# Patient Record
Sex: Female | Born: 1935 | Race: White | Hispanic: No | Smoking: Never smoker
Health system: Southern US, Community
[De-identification: ages and names within clinical notes are randomized; demographics above are authoritative.]

## PROBLEM LIST (undated history)

## (undated) DIAGNOSIS — F0390 Unspecified dementia without behavioral disturbance: Secondary | ICD-10-CM

## (undated) DIAGNOSIS — R011 Cardiac murmur, unspecified: Secondary | ICD-10-CM

## (undated) DIAGNOSIS — E7800 Pure hypercholesterolemia, unspecified: Secondary | ICD-10-CM

## (undated) DIAGNOSIS — E079 Disorder of thyroid, unspecified: Secondary | ICD-10-CM

## (undated) DIAGNOSIS — I10 Essential (primary) hypertension: Secondary | ICD-10-CM

## (undated) DIAGNOSIS — K649 Unspecified hemorrhoids: Secondary | ICD-10-CM

## (undated) DIAGNOSIS — I219 Acute myocardial infarction, unspecified: Secondary | ICD-10-CM

## (undated) DIAGNOSIS — E039 Hypothyroidism, unspecified: Secondary | ICD-10-CM

## (undated) DIAGNOSIS — K219 Gastro-esophageal reflux disease without esophagitis: Secondary | ICD-10-CM

## (undated) DIAGNOSIS — I609 Nontraumatic subarachnoid hemorrhage, unspecified: Secondary | ICD-10-CM

## (undated) HISTORY — PX: TONSILLECTOMY AND ADENOIDECTOMY: SUR1326

## (undated) HISTORY — PX: TOTAL HIP ARTHROPLASTY: SHX124

## (undated) HISTORY — PX: CORONARY ANGIOPLASTY: SHX604

## (undated) HISTORY — PX: JOINT REPLACEMENT: SHX530

---

## 1993-02-02 DIAGNOSIS — I219 Acute myocardial infarction, unspecified: Secondary | ICD-10-CM

## 1993-02-02 HISTORY — DX: Acute myocardial infarction, unspecified: I21.9

## 1997-07-10 ENCOUNTER — Other Ambulatory Visit: Admission: RE | Admit: 1997-07-10 | Discharge: 1997-07-10 | Payer: Self-pay | Admitting: Family Medicine

## 1998-02-04 ENCOUNTER — Ambulatory Visit (HOSPITAL_COMMUNITY): Admission: RE | Admit: 1998-02-04 | Discharge: 1998-02-04 | Payer: Self-pay | Admitting: Cardiology

## 1998-03-21 ENCOUNTER — Encounter: Payer: Self-pay | Admitting: Orthopedic Surgery

## 1998-03-25 ENCOUNTER — Inpatient Hospital Stay (HOSPITAL_COMMUNITY): Admission: RE | Admit: 1998-03-25 | Discharge: 1998-03-29 | Payer: Self-pay | Admitting: Orthopedic Surgery

## 1998-03-25 ENCOUNTER — Encounter: Payer: Self-pay | Admitting: Orthopedic Surgery

## 1998-04-02 ENCOUNTER — Encounter (HOSPITAL_COMMUNITY): Admission: RE | Admit: 1998-04-02 | Discharge: 1998-07-01 | Payer: Self-pay | Admitting: Orthopedic Surgery

## 2001-12-23 ENCOUNTER — Other Ambulatory Visit: Admission: RE | Admit: 2001-12-23 | Discharge: 2001-12-23 | Payer: Self-pay | Admitting: Family Medicine

## 2003-04-05 ENCOUNTER — Ambulatory Visit (HOSPITAL_COMMUNITY): Admission: RE | Admit: 2003-04-05 | Discharge: 2003-04-05 | Payer: Self-pay | Admitting: Gastroenterology

## 2004-01-04 ENCOUNTER — Ambulatory Visit: Admission: RE | Admit: 2004-01-04 | Discharge: 2004-01-04 | Payer: Self-pay | Admitting: Family Medicine

## 2005-01-24 ENCOUNTER — Emergency Department (HOSPITAL_COMMUNITY): Admission: EM | Admit: 2005-01-24 | Discharge: 2005-01-25 | Payer: Self-pay | Admitting: Emergency Medicine

## 2008-03-11 ENCOUNTER — Emergency Department (HOSPITAL_COMMUNITY): Admission: EM | Admit: 2008-03-11 | Discharge: 2008-03-11 | Payer: Self-pay | Admitting: Emergency Medicine

## 2008-03-12 ENCOUNTER — Encounter: Admission: RE | Admit: 2008-03-12 | Discharge: 2008-05-07 | Payer: Self-pay | Admitting: Family Medicine

## 2010-02-12 ENCOUNTER — Ambulatory Visit: Payer: Self-pay | Admitting: Cardiology

## 2010-05-20 LAB — HEMOCCULT GUIAC POC 1CARD (OFFICE): Fecal Occult Bld: NEGATIVE

## 2010-06-20 NOTE — Op Note (Signed)
NAMEDENEEN, SLAGER                       ACCOUNT NO.:  [id number]   MEDICAL RECORD NO.:  [id number]                   PATIENT TYPE:  AMB   LOCATION:  ENDO                                 FACILITY:  [facility]   PHYSICIAN:  Graylin Shiver, M.D.                DATE OF BIRTH:  10-22-1916   DATE OF PROCEDURE:  04/05/2003  DATE OF DISCHARGE:                                 OPERATIVE REPORT   PROCEDURE:  Colonoscopy.   INDICATIONS FOR PROCEDURE:  Rectal bleeding.   CONSENT:  Informed consent was obtained after explanation of the risks of  bleeding, infection, and perforation.   PREMEDICATION:  Fentanyl 30 mcg IV, Versed 3.5 mg IV.   DESCRIPTION OF PROCEDURE:  With the patient in the left lateral decubitus  position, the rectal exam was performed.  No masses were felt.  The Olympus  colonoscope was inserted into the rectum and advanced around the colon to  the cecum.  Cecal landmarks were identified.  The colon was very tortuous.  The cecum and ascending colon were normal.  The transverse colon was normal.  The descending colon normal.  The sigmoid showed diverticulosis.  The rectum  was normal.  The scope was brought out.  Some small hemorrhoids were seen.  She tolerated the procedure well without complications.   IMPRESSION:  1. Sigmoid diverticulosis.  2. Small hemorrhoids.                                               Graylin Shiver, M.D.    Germain Osgood  D:  04/05/2003  T:  04/05/2003  Job:  [id number]   cc:   Dellis Anes. Idell Pickles, M.D.  [address]  [place]  [place] [address]  Fax: [phone]

## 2010-08-08 ENCOUNTER — Emergency Department (HOSPITAL_COMMUNITY)
Admission: EM | Admit: 2010-08-08 | Discharge: 2010-08-08 | Disposition: A | Discharge status: Home / Self Care | Payer: Medicare Other | Attending: Emergency Medicine | Admitting: Emergency Medicine

## 2010-08-08 ENCOUNTER — Emergency Department (HOSPITAL_COMMUNITY): Payer: Medicare Other

## 2010-08-08 DIAGNOSIS — R0602 Shortness of breath: Secondary | ICD-10-CM | POA: Insufficient documentation

## 2010-08-08 DIAGNOSIS — R42 Dizziness and giddiness: Secondary | ICD-10-CM | POA: Insufficient documentation

## 2010-08-08 DIAGNOSIS — I10 Essential (primary) hypertension: Secondary | ICD-10-CM | POA: Insufficient documentation

## 2010-08-08 DIAGNOSIS — R5381 Other malaise: Secondary | ICD-10-CM | POA: Insufficient documentation

## 2010-08-08 LAB — URINALYSIS, ROUTINE W REFLEX MICROSCOPIC
Glucose, UA: NEGATIVE mg/dL
Leukocytes, UA: NEGATIVE
Protein, ur: NEGATIVE mg/dL
pH: 8 (ref 5.0–8.0)

## 2010-08-08 LAB — DIFFERENTIAL
Basophils Absolute: 0 10*3/uL (ref 0.0–0.1)
Basophils Relative: 1 % (ref 0–1)
Eosinophils Absolute: 0.2 10*3/uL (ref 0.0–0.7)
Eosinophils Relative: 4 % (ref 0–5)
Lymphocytes Relative: 20 % (ref 12–46)
Lymphs Abs: 1.3 10*3/uL (ref 0.7–4.0)
Monocytes Absolute: 0.4 10*3/uL (ref 0.1–1.0)
Monocytes Relative: 6 % (ref 3–12)
Neutro Abs: 4.6 10*3/uL (ref 1.7–7.7)
Neutrophils Relative %: 70 % (ref 43–77)

## 2010-08-08 LAB — BASIC METABOLIC PANEL
CO2: 28 mEq/L (ref 19–32)
Chloride: 107 mEq/L (ref 96–112)
Glucose, Bld: 104 mg/dL — ABNORMAL HIGH (ref 70–99)
Potassium: 4.3 mEq/L (ref 3.5–5.1)
Sodium: 142 mEq/L (ref 135–145)

## 2010-08-08 LAB — CBC
Hemoglobin: 13.4 g/dL (ref 12.0–15.0)
RBC: 4.26 MIL/uL (ref 3.87–5.11)

## 2010-10-02 ENCOUNTER — Emergency Department (HOSPITAL_COMMUNITY)
Admission: EM | Admit: 2010-10-02 | Discharge: 2010-10-02 | Discharge status: Against Medical Advice | Payer: Medicare Other | Attending: Emergency Medicine | Admitting: Emergency Medicine

## 2010-10-02 DIAGNOSIS — K5900 Constipation, unspecified: Secondary | ICD-10-CM | POA: Insufficient documentation

## 2010-10-02 DIAGNOSIS — I10 Essential (primary) hypertension: Secondary | ICD-10-CM | POA: Insufficient documentation

## 2010-10-02 DIAGNOSIS — E039 Hypothyroidism, unspecified: Secondary | ICD-10-CM | POA: Insufficient documentation

## 2011-01-12 ENCOUNTER — Inpatient Hospital Stay (HOSPITAL_BASED_OUTPATIENT_CLINIC_OR_DEPARTMENT_OTHER)
Admission: EM | Admit: 2011-01-12 | Discharge: 2011-01-13 | DRG: 087 | Disposition: A | Discharge status: Home / Self Care | Payer: Medicare Other | Source: Ambulatory Visit | Attending: Neurology | Admitting: Neurology

## 2011-01-12 ENCOUNTER — Emergency Department (INDEPENDENT_AMBULATORY_CARE_PROVIDER_SITE_OTHER): Payer: Medicare Other

## 2011-01-12 ENCOUNTER — Encounter: Payer: Self-pay | Admitting: *Deleted

## 2011-01-12 DIAGNOSIS — M25569 Pain in unspecified knee: Secondary | ICD-10-CM

## 2011-01-12 DIAGNOSIS — Z96649 Presence of unspecified artificial hip joint: Secondary | ICD-10-CM

## 2011-01-12 DIAGNOSIS — S0990XA Unspecified injury of head, initial encounter: Secondary | ICD-10-CM

## 2011-01-12 DIAGNOSIS — S066XAA Traumatic subarachnoid hemorrhage with loss of consciousness status unknown, initial encounter: Principal | ICD-10-CM | POA: Diagnosis present

## 2011-01-12 DIAGNOSIS — W19XXXA Unspecified fall, initial encounter: Secondary | ICD-10-CM | POA: Diagnosis present

## 2011-01-12 DIAGNOSIS — Z79899 Other long term (current) drug therapy: Secondary | ICD-10-CM

## 2011-01-12 DIAGNOSIS — I609 Nontraumatic subarachnoid hemorrhage, unspecified: Secondary | ICD-10-CM

## 2011-01-12 DIAGNOSIS — E079 Disorder of thyroid, unspecified: Secondary | ICD-10-CM | POA: Diagnosis present

## 2011-01-12 DIAGNOSIS — I10 Essential (primary) hypertension: Secondary | ICD-10-CM | POA: Diagnosis present

## 2011-01-12 DIAGNOSIS — M949 Disorder of cartilage, unspecified: Secondary | ICD-10-CM

## 2011-01-12 DIAGNOSIS — S8000XA Contusion of unspecified knee, initial encounter: Secondary | ICD-10-CM | POA: Diagnosis present

## 2011-01-12 DIAGNOSIS — S066X9A Traumatic subarachnoid hemorrhage with loss of consciousness of unspecified duration, initial encounter: Principal | ICD-10-CM | POA: Diagnosis present

## 2011-01-12 DIAGNOSIS — I252 Old myocardial infarction: Secondary | ICD-10-CM

## 2011-01-12 DIAGNOSIS — IMO0002 Reserved for concepts with insufficient information to code with codable children: Secondary | ICD-10-CM

## 2011-01-12 DIAGNOSIS — S0003XA Contusion of scalp, initial encounter: Secondary | ICD-10-CM

## 2011-01-12 DIAGNOSIS — E7800 Pure hypercholesterolemia, unspecified: Secondary | ICD-10-CM | POA: Diagnosis present

## 2011-01-12 HISTORY — DX: Gastro-esophageal reflux disease without esophagitis: K21.9

## 2011-01-12 HISTORY — DX: Essential (primary) hypertension: I10

## 2011-01-12 HISTORY — DX: Unspecified hemorrhoids: K64.9

## 2011-01-12 HISTORY — DX: Disorder of thyroid, unspecified: E07.9

## 2011-01-12 HISTORY — DX: Nontraumatic subarachnoid hemorrhage, unspecified: I60.9

## 2011-01-12 HISTORY — DX: Hypothyroidism, unspecified: E03.9

## 2011-01-12 HISTORY — DX: Cardiac murmur, unspecified: R01.1

## 2011-01-12 HISTORY — DX: Pure hypercholesterolemia, unspecified: E78.00

## 2011-01-12 HISTORY — DX: Acute myocardial infarction, unspecified: I21.9

## 2011-01-12 LAB — CBC
HCT: 41.2 % (ref 36.0–46.0)
Hemoglobin: 13.4 g/dL (ref 12.0–15.0)
RDW: 13.1 % (ref 11.5–15.5)
WBC: 12.1 10*3/uL — ABNORMAL HIGH (ref 4.0–10.5)

## 2011-01-12 LAB — PROTIME-INR
INR: 1.01 (ref 0.00–1.49)
Prothrombin Time: 13.5 seconds (ref 11.6–15.2)

## 2011-01-12 LAB — APTT: aPTT: 27 seconds (ref 24–37)

## 2011-01-12 LAB — BASIC METABOLIC PANEL
CO2: 26 mEq/L (ref 19–32)
Chloride: 107 mEq/L (ref 96–112)
Creatinine, Ser: 0.6 mg/dL (ref 0.50–1.10)
Potassium: 3.8 mEq/L (ref 3.5–5.1)

## 2011-01-12 LAB — DIFFERENTIAL
Basophils Absolute: 0 10*3/uL (ref 0.0–0.1)
Lymphocytes Relative: 14 % (ref 12–46)
Monocytes Absolute: 0.9 10*3/uL (ref 0.1–1.0)
Neutro Abs: 9.4 10*3/uL — ABNORMAL HIGH (ref 1.7–7.7)

## 2011-01-12 MED ORDER — LEVOTHYROXINE SODIUM 100 MCG PO TABS
100.0000 ug | ORAL_TABLET | Freq: Every day | ORAL | Status: DC
  Administered 2011-01-13: 100 ug via ORAL
  Filled 2011-01-12 (×2): qty 1

## 2011-01-12 MED ORDER — OLMESARTAN 10 MG HALF TABLET
10.0000 mg | ORAL_TABLET | Freq: Every day | ORAL | Status: DC
  Administered 2011-01-12 – 2011-01-13 (×2): 10 mg via ORAL
  Filled 2011-01-12 (×2): qty 1

## 2011-01-12 MED ORDER — SIMVASTATIN 40 MG PO TABS
40.0000 mg | ORAL_TABLET | Freq: Every day | ORAL | Status: DC
  Administered 2011-01-12: 40 mg via ORAL
  Filled 2011-01-12 (×2): qty 1

## 2011-01-12 NOTE — ED Notes (Signed)
Pt states she needs bedpan again.the patient voided 350 cc clear yellow urine

## 2011-01-12 NOTE — ED Provider Notes (Addendum)
History     CSN: [id number] Arrival date & time: 01/12/2011 11:18 AM   First MD Initiated Contact with Patient 01/12/11 1253      Chief Complaint  Patient presents with  . Fall    (Consider location/radiation/quality/duration/timing/severity/associated sxs/prior treatment) HPI Patient tripped and fell on standing at the [place] 10 AM today striking her head and right knee. She initially complained of headache and right knee pain now is without complaint. No treatment prior to coming here pain is nonradiating and mild. No other associated symptoms Past Medical History  Diagnosis Date  . Hypertension   . Thyroid disease   . High cholesterol    Myocardial infarction Past Surgical History  Procedure Date  . Cardiac surgery    Bilateral hip replacement No family history on file.  History  Substance Use Topics  . Smoking status: Never Smoker   . Smokeless tobacco: Not on file  . Alcohol Use: No   No alcohol no illicit drugs OB History    Grav Para Term Preterm Abortions TAB SAB Ect Mult Living                  Review of Systems  Constitutional: Negative.   HENT: Negative.   Respiratory: Negative.   Cardiovascular: Negative.   Gastrointestinal: Negative.   Musculoskeletal: Negative.        Walks with cane  Skin: Negative.   Neurological: Negative.   Hematological: Negative.   Psychiatric/Behavioral: Negative.     Allergies  Review of patient's allergies indicates no known allergies.  Home Medications   Current Outpatient Rx  Name Route Sig Dispense Refill  . SYNTHROID PO Oral Take by mouth.      Marland Kitchen SIMVASTATIN PO Oral Take by mouth.      . TRAMADOL HCL PO Oral Take by mouth.      . DIOVAN PO Oral Take by mouth.        BP 138/78  Pulse 82  Temp(Src) 97.9 F (36.6 C) (Oral)  Resp 20  Ht 5\' 3"  (1.6 m)  Wt 132 lb (59.875 kg)  BMI 23.38 kg/m2  SpO2 97%  Physical Exam  Constitutional: She appears well-developed and well-nourished.  HENT:       Golf  ball size hematoma at right temporal area otherwise atraumatic. Bilateral tympanic membranes normal  Eyes: Conjunctivae are normal. Pupils are equal, round, and reactive to light.  Neck: Neck supple. No tracheal deviation present. No thyromegaly present.  Cardiovascular: Normal rate and regular rhythm.   No murmur heard. Pulmonary/Chest: Effort normal and breath sounds normal.  Abdominal: Soft. Bowel sounds are normal. She exhibits no distension. There is no tenderness.  Musculoskeletal: Normal range of motion. She exhibits no edema and no tenderness.  Neurological: She is alert. No cranial nerve deficit. Coordination normal.       Gait is normal, walks with cane  Skin: Skin is warm and dry. No rash noted.  Psychiatric: She has a normal mood and affect.    ED Course  Procedures (including critical care time)  Labs Reviewed - No data to display No results found.  01/12/2011  No diagnosis found. Results for orders placed during the hospital encounter of 08/08/10  DIFFERENTIAL      Component Value Range   Neutrophils Relative 70  43 - 77 (%)   Neutro Abs 4.6  1.7 - 7.7 (K/uL)   Lymphocytes Relative 20  12 - 46 (%)   Lymphs Abs 1.3  0.7 - 4.0 (K/uL)  Monocytes Relative 6  3 - 12 (%)   Monocytes Absolute 0.4  0.1 - 1.0 (K/uL)   Eosinophils Relative 4  0 - 5 (%)   Eosinophils Absolute 0.2  0.0 - 0.7 (K/uL)   Basophils Relative 1  0 - 1 (%)   Basophils Absolute 0.0  0.0 - 0.1 (K/uL)  CBC      Component Value Range   WBC 6.6  4.0 - 10.5 (K/uL)   RBC 4.26  3.87 - 5.11 (MIL/uL)   Hemoglobin 13.4  12.0 - 15.0 (g/dL)   HCT 16.1  09.6 - 04.5 (%)   MCV 95.1  78.0 - 100.0 (fL)   MCH 31.5  26.0 - 34.0 (pg)   MCHC 33.1  30.0 - 36.0 (g/dL)   RDW 40.9  81.1 - 91.4 (%)   Platelets 246  150 - 400 (K/uL)  BASIC METABOLIC PANEL      Component Value Range   Sodium 142  135 - 145 (mEq/L)   Potassium 4.3  3.5 - 5.1 (mEq/L)   Chloride 107  96 - 112 (mEq/L)   CO2 28  19 - 32 (mEq/L)    Glucose, Bld 104 (*) 70 - 99 (mg/dL)   BUN 18  6 - 23 (mg/dL)   Creatinine, Ser 7.82  0.50 - 1.10 (mg/dL)   Calcium 9.1  8.4 - 95.6 (mg/dL)   GFR calc non Af Amer >60  >60 (mL/min)   GFR calc Af Amer >60  >60 (mL/min)  TROPONIN I      Component Value Range   Troponin I <0.30  <0.30 (ng/mL)  URINALYSIS, ROUTINE W REFLEX MICROSCOPIC      Component Value Range   Color, Urine YELLOW  YELLOW    APPearance CLEAR  CLEAR    Specific Gravity, Urine 1.013  1.005 - 1.030    pH 8.0  5.0 - 8.0    Glucose, UA NEGATIVE  NEGATIVE (mg/dL)   Hgb urine dipstick NEGATIVE  NEGATIVE    Bilirubin Urine NEGATIVE  NEGATIVE    Ketones, ur NEGATIVE  NEGATIVE (mg/dL)   Protein, ur NEGATIVE  NEGATIVE (mg/dL)   Urobilinogen, UA 0.2  0.0 - 1.0 (mg/dL)   Nitrite NEGATIVE  NEGATIVE    Leukocytes, UA NEGATIVE  NEGATIVE    Ct Head Wo Contrast  01/12/2011  *RADIOLOGY REPORT*  Clinical Data: Larey Seat.  CT HEAD WITHOUT CONTRAST  Technique:  Contiguous axial images were obtained from the base of the skull through the vertex without contrast.  Comparison: None.  Findings: Opacification sphenoid sinus air cells without findings of skull base fracture.  Right frontal subcutaneous small hematoma.  Tiny amount of subarachnoid blood anterior right frontal sulcus may be present (series 2 image 16).  It is possible this appearance is related to a vessel and can be assessed on follow-up.  No other evidence of intracranial hemorrhage.  Small vessel disease type changes.  No CT evidence of large acute infarct.  Small acute infarct cannot be excluded by CT.  No intracranial mass lesion detected on this unenhanced exam.  Vascular calcifications.  IMPRESSION: Right frontal small subcutaneous hematoma without underlying skull fracture.  Question tiny amount of subarachnoid blood right frontal lobe as noted above.  Small vessel disease type changes.  Vascular calcifications.  Sphenoid sinus opacification suggestive of sinus disease rather than  result of skull base trauma.  Results discussed with Dr. Ethelda Chick 01/12/2011 2:00 p.m.  Original Report Authenticated By: Fuller Canada, M.D.   Dg Knee  Complete 4 Views Right  01/12/2011  *RADIOLOGY REPORT*  Clinical Data: Right knee pain post fall  RIGHT KNEE - COMPLETE 4+ VIEW  Comparison: None.  Findings: Four views of the right knee submitted.  No acute fracture or subluxation.  There is diffuse osteopenia.  Narrowing of patellofemoral joint space.  Mild narrowing of medial joint compartment.  Minimal spurring of femoral condyles.  Mild suprapatellar soft tissue swelling.  Atherosclerotic calcifications of femoral artery.  IMPRESSION: No acute fracture or subluxation.  Diffuse osteopenia.  Mild degenerative changes.  Original Report Authenticated By: Natasha Mead, M.D.    2:10 PM patient remains alert appropriate Glasgow Coma Score 15. Declines pain medicine Spoke with Dr.Bezob, in light of CT scan findings we'll transfer to [facility] for observation Results for orders placed during the hospital encounter of 01/12/11  CBC      Component Value Range   WBC 12.1 (*) 4.0 - 10.5 (K/uL)   RBC 4.26  3.87 - 5.11 (MIL/uL)   Hemoglobin 13.4  12.0 - 15.0 (g/dL)   HCT 45.4  09.8 - 11.9 (%)   MCV 96.7  78.0 - 100.0 (fL)   MCH 31.5  26.0 - 34.0 (pg)   MCHC 32.5  30.0 - 36.0 (g/dL)   RDW 14.7  82.9 - 56.2 (%)   Platelets 255  150 - 400 (K/uL)  DIFFERENTIAL      Component Value Range   Neutrophils Relative 78 (*) 43 - 77 (%)   Neutro Abs 9.4 (*) 1.7 - 7.7 (K/uL)   Lymphocytes Relative 14  12 - 46 (%)   Lymphs Abs 1.6  0.7 - 4.0 (K/uL)   Monocytes Relative 7  3 - 12 (%)   Monocytes Absolute 0.9  0.1 - 1.0 (K/uL)   Eosinophils Relative 1  0 - 5 (%)   Eosinophils Absolute 0.1  0.0 - 0.7 (K/uL)   Basophils Relative 0  0 - 1 (%)   Basophils Absolute 0.0  0.0 - 0.1 (K/uL)  BASIC METABOLIC PANEL      Component Value Range   Sodium 144  135 - 145 (mEq/L)   Potassium 3.8  3.5 - 5.1 (mEq/L)     Chloride 107  96 - 112 (mEq/L)   CO2 26  19 - 32 (mEq/L)   Glucose, Bld 91  70 - 99 (mg/dL)   BUN 11  6 - 23 (mg/dL)   Creatinine, Ser 1.30  0.50 - 1.10 (mg/dL)   Calcium 9.2  8.4 - 86.5 (mg/dL)   GFR calc non Af Amer 77 (*) >90 (mL/min)   GFR calc Af Amer 89 (*) >90 (mL/min)  PROTIME-INR      Component Value Range   Prothrombin Time 13.5  11.6 - 15.2 (seconds)   INR 1.01  0.00 - 1.49   APTT      Component Value Range   aPTT 27  24 - 37 (seconds)   Ct Head Wo Contrast  01/12/2011  *RADIOLOGY REPORT*  Clinical Data: Larey Seat.  CT HEAD WITHOUT CONTRAST  Technique:  Contiguous axial images were obtained from the base of the skull through the vertex without contrast.  Comparison: None.  Findings: Opacification sphenoid sinus air cells without findings of skull base fracture.  Right frontal subcutaneous small hematoma.  Tiny amount of subarachnoid blood anterior right frontal sulcus may be present (series 2 image 16).  It is possible this appearance is related to a vessel and can be assessed on follow-up.  No other evidence  of intracranial hemorrhage.  Small vessel disease type changes.  No CT evidence of large acute infarct.  Small acute infarct cannot be excluded by CT.  No intracranial mass lesion detected on this unenhanced exam.  Vascular calcifications.  IMPRESSION: Right frontal small subcutaneous hematoma without underlying skull fracture.  Question tiny amount of subarachnoid blood right frontal lobe as noted above.  Small vessel disease type changes.  Vascular calcifications.  Sphenoid sinus opacification suggestive of sinus disease rather than result of skull base trauma.  Results discussed with Dr. Ethelda Chick 01/12/2011 2:00 p.m.  Original Report Authenticated By: Fuller Canada, M.D.   Dg Knee Complete 4 Views Right  01/12/2011  *RADIOLOGY REPORT*  Clinical Data: Right knee pain post fall  RIGHT KNEE - COMPLETE 4+ VIEW  Comparison: None.  Findings: Four views of the right knee  submitted.  No acute fracture or subluxation.  There is diffuse osteopenia.  Narrowing of patellofemoral joint space.  Mild narrowing of medial joint compartment.  Minimal spurring of femoral condyles.  Mild suprapatellar soft tissue swelling.  Atherosclerotic calcifications of femoral artery.  IMPRESSION: No acute fracture or subluxation.  Diffuse osteopenia.  Mild degenerative changes.  Original Report Authenticated By: Natasha Mead, M.D.    MDM  In light of CT scan findings we'll transfer to [facility] for observation Diagnosis #1 closed head trauma #2 contusion to right knee #3 fall        Doug Sou, MD 01/12/11 1417  Doug Sou, MD 01/12/11 1514

## 2011-01-12 NOTE — Consult Note (Addendum)
Reason for Consult:"fall, head trauma, traumatic, right SAH"  HPI: Marisa Sweeney is an 77 y.o. Female. Who made an awkward turn this am and fell while hitting the right side of her head. [facility] and was found to have traumatic, right frontal SAH on CT head. She was transferred here for monitoring.   Past Medical History  Diagnosis Date  . Hypertension   . Thyroid disease   . High cholesterol    Past Surgical History  Procedure Date  . Cardiac surgery    No family history on file.  Social History:  reports that she has never smoked. She does not have any smokeless tobacco history on file. She reports that she does not drink alcohol or use illicit drugs.  Allergies: No Known Allergies  Medications: I have reviewed the patient's current medications.  ROS: as above  Blood pressure 164/92, pulse 75, temperature 97.9 F (36.6 C), temperature source Oral, resp. rate 20, height 5\' 3"  (1.6 m), weight 59.875 kg (132 lb), SpO2 93.00%.  Neurological exam: AAO*3. No aphasia.  Was able to tell me months of the year forwards and backwards correctly, exhibiting good attention span. Followed complex commands. Cranial nerves: EOMI, PERRL. Visual fields were full. Sensation to V1 through V3 areas of the face was intact and symmetric throughout. There was no facial asymmetry. Hearing to finger rub was equal and symmetrical bilaterally. Shoulder shrug was 5/5 and symmetric bilaterally. Head rotation was 5/5 bilaterally. There was no dysarthria or palatal deviation. Motor: strength was 5/5 and symmetric throughout except proximal b/l LE which were 4+/5. Sensory: was intact throughout to light touch, pinprick. Coordination: finger-to-nose and heel-to-shin were intact and symmetric bilaterally. Reflexes: were 1+ in upper extremities and 0 at the knees and 0 at the ankles. Plantar response was downgoing bilaterally. Gait: deferred  Results for orders placed during the hospital  encounter of 01/12/11 (from the past 48 hour(s))  CBC     Status: Abnormal   Collection Time   01/12/11  2:15 PM      Component Value Range Comment   WBC 12.1 (*) 4.0 - 10.5 (K/uL)    RBC 4.26  3.87 - 5.11 (MIL/uL)    Hemoglobin 13.4  12.0 - 15.0 (g/dL)    HCT 04.5  40.9 - 81.1 (%)    MCV 96.7  78.0 - 100.0 (fL)    MCH 31.5  26.0 - 34.0 (pg)    MCHC 32.5  30.0 - 36.0 (g/dL)    RDW 91.4  78.2 - 95.6 (%)    Platelets 255  150 - 400 (K/uL)   DIFFERENTIAL     Status: Abnormal   Collection Time   01/12/11  2:15 PM      Component Value Range Comment   Neutrophils Relative 78 (*) 43 - 77 (%)    Neutro Abs 9.4 (*) 1.7 - 7.7 (K/uL)    Lymphocytes Relative 14  12 - 46 (%)    Lymphs Abs 1.6  0.7 - 4.0 (K/uL)    Monocytes Relative 7  3 - 12 (%)    Monocytes Absolute 0.9  0.1 - 1.0 (K/uL)    Eosinophils Relative 1  0 - 5 (%)    Eosinophils Absolute 0.1  0.0 - 0.7 (K/uL)    Basophils Relative 0  0 - 1 (%)    Basophils Absolute 0.0  0.0 - 0.1 (K/uL)   BASIC METABOLIC PANEL     Status: Abnormal  Collection Time   01/12/11  2:15 PM      Component Value Range Comment   Sodium 144  135 - 145 (mEq/L)    Potassium 3.8  3.5 - 5.1 (mEq/L)    Chloride 107  96 - 112 (mEq/L)    CO2 26  19 - 32 (mEq/L)    Glucose, Bld 91  70 - 99 (mg/dL)    BUN 11  6 - 23 (mg/dL)    Creatinine, Ser 1.61  0.50 - 1.10 (mg/dL)    Calcium 9.2  8.4 - 10.5 (mg/dL)    GFR calc non Af Amer 77 (*) >90 (mL/min)    GFR calc Af Amer 89 (*) >90 (mL/min)   PROTIME-INR     Status: Normal   Collection Time   01/12/11  2:15 PM      Component Value Range Comment   Prothrombin Time 13.5  11.6 - 15.2 (seconds)    INR 1.01  0.00 - 1.49    APTT     Status: Normal   Collection Time   01/12/11  2:15 PM      Component Value Range Comment   aPTT 27  24 - 37 (seconds)    Ct Head Wo Contrast  01/12/2011  *RADIOLOGY REPORT*  Clinical Data: Larey Seat.  CT HEAD WITHOUT CONTRAST  Technique:  Contiguous axial images were obtained from the  base of the skull through the vertex without contrast.  Comparison: None.  Findings: Opacification sphenoid sinus air cells without findings of skull base fracture.  Right frontal subcutaneous small hematoma.  Tiny amount of subarachnoid blood anterior right frontal sulcus may be present (series 2 image 16).  It is possible this appearance is related to a vessel and can be assessed on follow-up.  No other evidence of intracranial hemorrhage.  Small vessel disease type changes.  No CT evidence of large acute infarct.  Small acute infarct cannot be excluded by CT.  No intracranial mass lesion detected on this unenhanced exam.  Vascular calcifications.  IMPRESSION: Right frontal small subcutaneous hematoma without underlying skull fracture.  Question tiny amount of subarachnoid blood right frontal lobe as noted above.  Small vessel disease type changes.  Vascular calcifications.  Sphenoid sinus opacification suggestive of sinus disease rather than result of skull base trauma.  Results discussed with Dr. Ethelda Chick 01/12/2011 2:00 p.m.  Original Report Authenticated By: Fuller Canada, M.D.   Dg Knee Complete 4 Views Right  01/12/2011  *RADIOLOGY REPORT*  Clinical Data: Right knee pain post fall  RIGHT KNEE - COMPLETE 4+ VIEW  Comparison: None.  Findings: Four views of the right knee submitted.  No acute fracture or subluxation.  There is diffuse osteopenia.  Narrowing of patellofemoral joint space.  Mild narrowing of medial joint compartment.  Minimal spurring of femoral condyles.  Mild suprapatellar soft tissue swelling.  Atherosclerotic calcifications of femoral artery.  IMPRESSION: No acute fracture or subluxation.  Diffuse osteopenia.  Mild degenerative changes.  Original Report Authenticated By: Natasha Mead, M.D.   Assessment/Plan: 77 years old woman with fall this morning after an awkward turn with trauma to right side of her head and possible SAH in right frontal sulcus that is traumatic in etiology 1)  Admit for overnight observation 2) If stable, will go home tomorrow with neurology follow-up 3) Physical Therapy  Jilian West 01/12/2011, 5:12 PM

## 2011-01-12 NOTE — H&P (Addendum)
Reason for Consult:"fall, head trauma, traumatic, right SAH"  HPI: Marisa Sweeney is an 77 y.o. Female. Who made an awkward turn this am and fell while hitting the right side of her head. She [facility] ED in a [facility] hospital and was found to have traumatic, right frontal SAH on CT head. She was transferred here for monitoring.   Past Medical History  Diagnosis Date  . Hypertension   . Thyroid disease   . High cholesterol    Past Surgical History  Procedure Date  . Cardiac surgery    No family history on file.  Social History:  reports that she has never smoked. She does not have any smokeless tobacco history on file. She reports that she does not drink alcohol or use illicit drugs.  Allergies: No Known Allergies  Medications: I have reviewed the patient's current medications.  ROS: as above  Blood pressure 164/92, pulse 75, temperature 97.9 F (36.6 C), temperature source Oral, resp. rate 20, height 5' 3" (1.6 m), weight 59.875 kg (132 lb), SpO2 93.00%.  Neurological exam: AAO*3. No aphasia.  Was able to tell me months of the year forwards and backwards correctly, exhibiting good attention span. Followed complex commands. Cranial nerves: EOMI, PERRL. Visual fields were full. Sensation to V1 through V3 areas of the face was intact and symmetric throughout. There was no facial asymmetry. Hearing to finger rub was equal and symmetrical bilaterally. Shoulder shrug was 5/5 and symmetric bilaterally. Head rotation was 5/5 bilaterally. There was no dysarthria or palatal deviation. Motor: strength was 5/5 and symmetric throughout except proximal b/l LE which were 4+/5. Sensory: was intact throughout to light touch, pinprick. Coordination: finger-to-nose and heel-to-shin were intact and symmetric bilaterally. Reflexes: were 1+ in upper extremities and 0 at the knees and 0 at the ankles. Plantar response was downgoing bilaterally. Gait: deferred  Results for orders placed during the hospital  encounter of 01/12/11 (from the past 48 hour(s))  CBC     Status: Abnormal   Collection Time   01/12/11  2:15 PM      Component Value Range Comment   WBC 12.1 (*) 4.0 - 10.5 (K/uL)    RBC 4.26  3.87 - 5.11 (MIL/uL)    Hemoglobin 13.4  12.0 - 15.0 (g/dL)    HCT 41.2  36.0 - 46.0 (%)    MCV 96.7  78.0 - 100.0 (fL)    MCH 31.5  26.0 - 34.0 (pg)    MCHC 32.5  30.0 - 36.0 (g/dL)    RDW 13.1  11.5 - 15.5 (%)    Platelets 255  150 - 400 (K/uL)   DIFFERENTIAL     Status: Abnormal   Collection Time   01/12/11  2:15 PM      Component Value Range Comment   Neutrophils Relative 78 (*) 43 - 77 (%)    Neutro Abs 9.4 (*) 1.7 - 7.7 (K/uL)    Lymphocytes Relative 14  12 - 46 (%)    Lymphs Abs 1.6  0.7 - 4.0 (K/uL)    Monocytes Relative 7  3 - 12 (%)    Monocytes Absolute 0.9  0.1 - 1.0 (K/uL)    Eosinophils Relative 1  0 - 5 (%)    Eosinophils Absolute 0.1  0.0 - 0.7 (K/uL)    Basophils Relative 0  0 - 1 (%)    Basophils Absolute 0.0  0.0 - 0.1 (K/uL)   BASIC METABOLIC PANEL     Status: Abnormal     Collection Time   01/12/11  2:15 PM      Component Value Range Comment   Sodium 144  135 - 145 (mEq/L)    Potassium 3.8  3.5 - 5.1 (mEq/L)    Chloride 107  96 - 112 (mEq/L)    CO2 26  19 - 32 (mEq/L)    Glucose, Bld 91  70 - 99 (mg/dL)    BUN 11  6 - 23 (mg/dL)    Creatinine, Ser 0.60  0.50 - 1.10 (mg/dL)    Calcium 9.2  8.4 - 10.5 (mg/dL)    GFR calc non Af Amer 77 (*) >90 (mL/min)    GFR calc Af Amer 89 (*) >90 (mL/min)   PROTIME-INR     Status: Normal   Collection Time   01/12/11  2:15 PM      Component Value Range Comment   Prothrombin Time 13.5  11.6 - 15.2 (seconds)    INR 1.01  0.00 - 1.49    APTT     Status: Normal   Collection Time   01/12/11  2:15 PM      Component Value Range Comment   aPTT 27  24 - 37 (seconds)    Ct Head Wo Contrast  01/12/2011  *RADIOLOGY REPORT*  Clinical Data: Fell.  CT HEAD WITHOUT CONTRAST  Technique:  Contiguous axial images were obtained from the  base of the skull through the vertex without contrast.  Comparison: None.  Findings: Opacification sphenoid sinus air cells without findings of skull base fracture.  Right frontal subcutaneous small hematoma.  Tiny amount of subarachnoid blood anterior right frontal sulcus may be present (series 2 image 16).  It is possible this appearance is related to a vessel and can be assessed on follow-up.  No other evidence of intracranial hemorrhage.  Small vessel disease type changes.  No CT evidence of large acute infarct.  Small acute infarct cannot be excluded by CT.  No intracranial mass lesion detected on this unenhanced exam.  Vascular calcifications.  IMPRESSION: Right frontal small subcutaneous hematoma without underlying skull fracture.  Question tiny amount of subarachnoid blood right frontal lobe as noted above.  Small vessel disease type changes.  Vascular calcifications.  Sphenoid sinus opacification suggestive of sinus disease rather than result of skull base trauma.  Results discussed with Dr. Jacubowitz 01/12/2011 2:00 p.m.  Original Report Authenticated By: STEVEN R. OLSON, M.D.   Dg Knee Complete 4 Views Right  01/12/2011  *RADIOLOGY REPORT*  Clinical Data: Right knee pain post fall  RIGHT KNEE - COMPLETE 4+ VIEW  Comparison: None.  Findings: Four views of the right knee submitted.  No acute fracture or subluxation.  There is diffuse osteopenia.  Narrowing of patellofemoral joint space.  Mild narrowing of medial joint compartment.  Minimal spurring of femoral condyles.  Mild suprapatellar soft tissue swelling.  Atherosclerotic calcifications of femoral artery.  IMPRESSION: No acute fracture or subluxation.  Diffuse osteopenia.  Mild degenerative changes.  Original Report Authenticated By: LIVIU POP, M.D.   Assessment/Plan: 77-years-old woman with fall this morning after an awkward turn with trauma to right side of her head and possible SAH in right frontal sulcus that is traumatic in etiology 1)  Admit for overnight observation 2) If stable, will go home tomorrow with neurology follow-up 3) Physical Therapy  Dequandre Cordova 01/12/2011, 5:12 PM        Daion Ginsberg  01/12/2011, 5:12 PM

## 2011-01-12 NOTE — ED Notes (Signed)
Pt requested bed pan. Pt voided 300 cc clear yellow urine

## 2011-01-12 NOTE — ED Notes (Signed)
Pt requests bed pan..voided 250 cc clear yellow urine

## 2011-01-12 NOTE — Progress Notes (Signed)
Received pt to rm [id number] via stretcher,  Pt alert and oriented, vital signs stable, assisted to [facility], oriented to room, and use of call lights.  Pt states she is nearly deaf, and is also having frequency and urgency this afternoon. No other compliants voiced at this time...  Dr. Homero Fellers notified of pts arrival on unit.

## 2011-01-12 NOTE — ED Notes (Signed)
Pt denies pain at present bruising noted on forehead right side near hair line fell onto padded carpeted floor no loss of consciousness also has soreness in right knee no deformity or bruising noted on exam.

## 2011-01-12 NOTE — ED Notes (Signed)
Larey Seat this am while [place] the [place] when she tried to turn around to quick. Hematoma forehead, right hand pain, and pain in her right knee.

## 2011-01-13 NOTE — Progress Notes (Signed)
Utilization review complete 

## 2011-01-13 NOTE — Progress Notes (Signed)
Pt is politely refusing home health therapy at this time. Benefits explained to pt and daughter and pt verbalized understanding but states "i just dont want that in my life at this point." Will cont to monitor. Minor, Yvette Rack

## 2011-01-13 NOTE — Progress Notes (Signed)
Physical Therapy Evaluation Patient Details Name: Marisa Sweeney MRN: [id number] DOB: Mar 20, 1916 Today's Date: 01/13/2011  Problem List: There is no problem list on file for this patient.   Past Medical History:  Past Medical History  Diagnosis Date  . Hypertension   . Thyroid disease   . High cholesterol     h/o  . Heart murmur   . Myocardial infarction 1995  . Hypothyroidism   . GERD (gastroesophageal reflux disease)   . Constipation   . Hemorrhoid     h/o  . SAH (subarachnoid hemorrhage) 01/12/11    right frontal   Past Surgical History:  Past Surgical History  Procedure Date  . Joint replacement   . Coronary angioplasty   . Total hip arthroplasty     right 1995; left 2000  . Tonsillectomy and adenoidectomy     "when I was little"    PT Assessment/Plan/Recommendation PT Assessment Clinical Impression Statement: Pt is 77 y.o. female s/p fall, hitting her head, suffering right frontal small subcutaneous hematoma without underlying skull fracture. Pt presents with considerable balance deficits and decreased awareness of deficits. Pt/dtr report frequent near falls, but state she rarely hits the ground, catching herself on furniture. Advised pt to begin using RW at all times for safety. Pt will benefit from skilled PT to increase balance and decrease risk of further falls. Standardized balance test limited due to decreased activity tolerance at time of eval.  PT Recommendation/Assessment: Patient will need skilled PT in the acute care venue PT Problem List: Decreased activity tolerance;Decreased balance;Decreased mobility;Decreased cognition;Decreased knowledge of use of DME;Decreased safety awareness;Decreased knowledge of precautions PT Therapy Diagnosis : Difficulty walking PT Plan PT Frequency: Min 3X/week PT Treatment/Interventions: DME instruction;Gait training;Stair training;Functional mobility training;Therapeutic activities;Therapeutic exercise;Balance  training;Neuromuscular re-education;Cognitive remediation;Patient/family education PT Recommendation Follow Up Recommendations: Home health PT Equipment Recommended: Rolling walker with 5" wheels PT Goals  Acute Rehab PT Goals PT Goal Formulation: With patient/family Time For Goal Achievement: 7 days Pt will go Supine/Side to Sit: with modified independence;with HOB 0 degrees PT Goal: Supine/Side to Sit - Progress: Progressing toward goal Pt will go Sit to Supine/Side: with modified independence;with HOB 0 degrees PT Goal: Sit to Supine/Side - Progress: Not met Pt will go Stand to Sit: with supervision PT Goal: Stand to Sit - Progress: Progressing toward goal Pt will Ambulate: 51 - 150 feet;with supervision;with rolling walker PT Goal: Ambulate - Progress: Progressing toward goal Pt will Go Up / Down Stairs: 3-5 stairs;with supervision;with rail(s) PT Goal: Up/Down Stairs - Progress: Not met  PT Evaluation Precautions/Restrictions  Precautions Precautions: Fall Precaution Comments: Educated pt/dtr on falls risk and home safety measures Required Braces or Orthoses: No Restrictions Weight Bearing Restrictions: No Prior Functioning  Home Living Lives With: Daughter Receives Help From: Family (Daughter is home most of the day) Type of Home: House Home Layout: Two level;Able to live on main level with bedroom/bathroom Alternate Level Stairs-Rails:  (Pt lives main level) Alternate Level Stairs-Number of Steps: NA Home Access: Stairs to enter Entrance Stairs-Rails: Right;Left Entrance Stairs-Number of Steps: 5 Home Adaptive Equipment: Straight cane;Walker - standard Prior Function Level of Independence: Independent with homemaking with ambulation;Requires assistive device for independence Able to Take Stairs?: Yes Cognition Cognition Arousal/Alertness: Awake/alert Overall Cognitive Status: Appears within functional limits for tasks assessed Orientation Level: Oriented  X4 Sensation/Coordination Sensation Light Touch: Appears Intact Stereognosis: Not tested Hot/Cold: Not tested Proprioception: Not tested Coordination Gross Motor Movements are Fluid and Coordinated: Yes Fine Motor Movements are  Fluid and Coordinated: Yes Extremity Assessment RUE Assessment RUE Assessment: Within Functional Limits LUE Assessment LUE Assessment: Within Functional Limits RLE Assessment RLE Assessment: Within Functional Limits LLE Assessment LLE Assessment: Within Functional Limits Mobility (including Balance) Bed Mobility Bed Mobility: Yes Supine to Sit: 5: Supervision;HOB flat Supine to Sit Details (indicate cue type and reason): Cuesffor initiation, but otherwise moves safely and easily Transfers Transfers: Yes Sit to Stand: With upper extremity assist;From bed;4: Min assist Sit to Stand Details (indicate cue type and reason): Pt had posterior LOB x2 with standing, using back of LEs and physical A to steady Stand to Sit: 5: Supervision;With upper extremity assist;To chair/3-in-1 Stand to Sit Details: Cues for hand placement Ambulation/Gait Ambulation/Gait: Yes Ambulation/Gait Assistance: 4: Min assist (Min with RW, Mod with cane) Ambulation/Gait Assistance Details (indicate cue type and reason): Pt had 3 LOB in 8ft with SEC to R, much more steady with RW, advised to use at all times.  Ambulation Distance (Feet): 30 Feet Assistive device: Straight cane;Rolling walker Gait Pattern: Step-to pattern;Decreased stance time - right;Trunk flexed;Shuffle Gait velocity: NT, but decreased Stairs: No Wheelchair Mobility Wheelchair Mobility: No  Posture/Postural Control Posture/Postural Control: No significant limitations Balance Balance Assessed: Yes Static Standing Balance Rhomberg - Eyes Opened: 10  Rhomberg - Eyes Closed: 5    End of Session PT - End of Session Equipment Utilized During Treatment: Gait belt Activity Tolerance: Patient limited by  fatigue Patient left: in chair;with call bell in reach;with family/visitor present Nurse Communication: Mobility status for transfers;Mobility status for ambulation General Behavior During Session: [facility] for tasks performed Cognition: [facility] for tasks performed  [facility] [place], [place] [phone] 01/13/2011, 2:41 PM

## 2011-01-13 NOTE — Progress Notes (Signed)
Received order for [facility] needs, PT and possible rolling walker as recommended by PT evaluation. However pt declined HH and rolling walker, has standard walker at home and family may purchase rolling walker later if pt decides this would be helpful. Dr Dorise Hiss notified of pt declining HH.  Johny Shock RN MPH Case Manager [phone]

## 2011-01-13 NOTE — Discharge Summary (Signed)
Internal Medicine Teaching [facility] Discharge Note  Name: Marisa Sweeney MRN: [id number] DOB: Dec 16, 1916 77 y.o.  Date of Admission: 01/12/2011 11:18 AM Date of Discharge: 01/13/2011 Attending Physician: Carmell Austria  Discharge Diagnosis: Right SAH   Discharge Medications: Current Discharge Medication List    CONTINUE these medications which have NOT CHANGED   Details  Calcium Carbonate-Vitamin D (CALTRATE 600+D) 600-400 MG-UNIT per tablet Take 1 tablet by mouth 2 (two) times daily.      levothyroxine (SYNTHROID, LEVOTHROID) 88 MCG tablet Take 88 mcg by mouth daily.      Misc Natural Products (GLUCOS-CHONDROIT-MSM COMPLEX PO) Take 1 tablet by mouth 2 (two) times daily.      Multiple Vitamins-Minerals (EYE VITAMINS PO) Take 1 tablet by mouth 2 (two) times daily.      SIMVASTATIN PO Take by mouth.     traMADol (ULTRAM) 50 MG tablet Take 50 mg by mouth every 6 (six) hours as needed. Maximum dose= 8 tablets per day  For pain     Valsartan (DIOVAN PO) Take by mouth.          Disposition and follow-up:   Marisa Sweeney was discharged from [facility] in Good condition.    Follow-up Appointments: With your regular doctor in 1-2 weeks.    Consultations:  Physical therapy   Procedures Performed:  Ct Head Wo Contrast  01/12/2011  *RADIOLOGY REPORT*  Clinical Data: Larey Seat.  CT HEAD WITHOUT CONTRAST  Technique:  Contiguous axial images were obtained from the base of the skull through the vertex without contrast.  Comparison: None.  Findings: Opacification sphenoid sinus air cells without findings of skull base fracture.  Right frontal subcutaneous small hematoma.  Tiny amount of subarachnoid blood anterior right frontal sulcus may be present (series 2 image 16).  It is possible this appearance is related to a vessel and can be assessed on follow-up.  No other evidence of intracranial hemorrhage.  Small vessel disease type changes.  No CT evidence of  large acute infarct.  Small acute infarct cannot be excluded by CT.  No intracranial mass lesion detected on this unenhanced exam.  Vascular calcifications.  IMPRESSION: Right frontal small subcutaneous hematoma without underlying skull fracture.  Question tiny amount of subarachnoid blood right frontal lobe as noted above.  Small vessel disease type changes.  Vascular calcifications.  Sphenoid sinus opacification suggestive of sinus disease rather than result of skull base trauma.  Results discussed with Dr. Ethelda Chick 01/12/2011 2:00 p.m.  Original Report Authenticated By: Fuller Canada, M.D.   Dg Knee Complete 4 Views Right  01/12/2011  *RADIOLOGY REPORT*  Clinical Data: Right knee pain post fall  RIGHT KNEE - COMPLETE 4+ VIEW  Comparison: None.  Findings: Four views of the right knee submitted.  No acute fracture or subluxation.  There is diffuse osteopenia.  Narrowing of patellofemoral joint space.  Mild narrowing of medial joint compartment.  Minimal spurring of femoral condyles.  Mild suprapatellar soft tissue swelling.  Atherosclerotic calcifications of femoral artery.  IMPRESSION: No acute fracture or subluxation.  Diffuse osteopenia.  Mild degenerative changes.  Original Report Authenticated By: Natasha Mead, M.D.    Admission HPI: Marisa Sweeney is an 77 y.o. Female. Who made an awkward turn this am and fell while hitting the right side of her head. [facility] and was found to have traumatic, right frontal SAH on CT head. She was transferred here for monitoring.  Hospital Course by problem list: Right SAH - Pt was seen for monitoring overnight and had no incidents. She is not having a headache on the day of discharge. She was seen by PT who cleared her to go home with home health PT. Case management offered to set this up and the pt declined. She was determined to be fit to go home and was discharged home in good condition. She was told to follow up with her  doctor and to return if she has new or worsening headache. She was given [facility]'s number in case she has new or worsening symptoms and needs to follow up.   Discharge Vitals:  BP 130/60  Pulse 71  Temp(Src) 97.8 F (36.6 C) (Oral)  Resp 18  Ht 5\' 3"  (1.6 m)  Wt 132 lb (59.875 kg)  BMI 23.38 kg/m2  SpO2 99%  Discharge Labs:  Results for orders placed during the hospital encounter of 01/12/11 (from the past 24 hour(s))  CBC     Status: Abnormal   Collection Time   01/12/11  2:15 PM      Component Value Range   WBC 12.1 (*) 4.0 - 10.5 (K/uL)   RBC 4.26  3.87 - 5.11 (MIL/uL)   Hemoglobin 13.4  12.0 - 15.0 (g/dL)   HCT 62.9  52.8 - 41.3 (%)   MCV 96.7  78.0 - 100.0 (fL)   MCH 31.5  26.0 - 34.0 (pg)   MCHC 32.5  30.0 - 36.0 (g/dL)   RDW 24.4  01.0 - 27.2 (%)   Platelets 255  150 - 400 (K/uL)  DIFFERENTIAL     Status: Abnormal   Collection Time   01/12/11  2:15 PM      Component Value Range   Neutrophils Relative 78 (*) 43 - 77 (%)   Neutro Abs 9.4 (*) 1.7 - 7.7 (K/uL)   Lymphocytes Relative 14  12 - 46 (%)   Lymphs Abs 1.6  0.7 - 4.0 (K/uL)   Monocytes Relative 7  3 - 12 (%)   Monocytes Absolute 0.9  0.1 - 1.0 (K/uL)   Eosinophils Relative 1  0 - 5 (%)   Eosinophils Absolute 0.1  0.0 - 0.7 (K/uL)   Basophils Relative 0  0 - 1 (%)   Basophils Absolute 0.0  0.0 - 0.1 (K/uL)  BASIC METABOLIC PANEL     Status: Abnormal   Collection Time   01/12/11  2:15 PM      Component Value Range   Sodium 144  135 - 145 (mEq/L)   Potassium 3.8  3.5 - 5.1 (mEq/L)   Chloride 107  96 - 112 (mEq/L)   CO2 26  19 - 32 (mEq/L)   Glucose, Bld 91  70 - 99 (mg/dL)   BUN 11  6 - 23 (mg/dL)   Creatinine, Ser 5.36  0.50 - 1.10 (mg/dL)   Calcium 9.2  8.4 - 64.4 (mg/dL)   GFR calc non Af Amer 77 (*) >90 (mL/min)   GFR calc Af Amer 89 (*) >90 (mL/min)  PROTIME-INR     Status: Normal   Collection Time   01/12/11  2:15 PM      Component Value Range   Prothrombin Time 13.5  11.6 - 15.2  (seconds)   INR 1.01  0.00 - 1.49   APTT     Status: Normal   Collection Time   01/12/11  2:15 PM      Component Value Range   aPTT 27  24 - 37 (seconds)    Signed: Genella Mech 01/13/2011, 9:35 AM

## 2011-02-16 ENCOUNTER — Encounter: Payer: Self-pay | Admitting: Internal Medicine

## 2011-02-20 ENCOUNTER — Ambulatory Visit: Payer: BLUE CROSS/BLUE SHIELD | Admitting: Internal Medicine

## 2011-02-20 ENCOUNTER — Encounter: Payer: BLUE CROSS/BLUE SHIELD | Admitting: Internal Medicine

## 2011-02-23 ENCOUNTER — Encounter: Payer: Self-pay | Admitting: Internal Medicine

## 2011-02-23 ENCOUNTER — Ambulatory Visit (INDEPENDENT_AMBULATORY_CARE_PROVIDER_SITE_OTHER): Payer: Medicare Other | Admitting: Internal Medicine

## 2011-02-23 DIAGNOSIS — I447 Left bundle-branch block, unspecified: Secondary | ICD-10-CM

## 2011-02-23 DIAGNOSIS — E785 Hyperlipidemia, unspecified: Secondary | ICD-10-CM

## 2011-02-23 DIAGNOSIS — I359 Nonrheumatic aortic valve disorder, unspecified: Secondary | ICD-10-CM

## 2011-02-23 DIAGNOSIS — I10 Essential (primary) hypertension: Secondary | ICD-10-CM | POA: Insufficient documentation

## 2011-02-23 DIAGNOSIS — I350 Nonrheumatic aortic (valve) stenosis: Secondary | ICD-10-CM | POA: Insufficient documentation

## 2011-02-23 NOTE — Assessment & Plan Note (Signed)
Adequate control. 

## 2011-02-23 NOTE — Patient Instructions (Signed)
Your physician wants you to follow-up in: 12 months with Dr. Ross. You will receive a reminder letter in the mail two months in advance. If you don't receive a letter, please call our office to schedule the follow-up appointment.   

## 2011-02-23 NOTE — Assessment & Plan Note (Signed)
Old

## 2011-02-23 NOTE — Assessment & Plan Note (Signed)
MIld on exam.  I would follow  No testing

## 2011-02-23 NOTE — Progress Notes (Signed)
HPI Patient is a 78 year old with a history of HTN and mild aortic stenosis.  She was previously seen by S. Tennant.  Last seen 1 year ago. Since then she has done fairly well from a cardiac standpoint.  Breathing is unchanged.  No signif dizziness.  No CP.  No palpitations.   No Known Allergies  Current Outpatient Prescriptions  Medication Sig Dispense Refill  . Calcium Carbonate-Vitamin D (CALTRATE 600+D) 600-400 MG-UNIT per tablet Take 1 tablet by mouth 2 (two) times daily.        Marland Kitchen levothyroxine (SYNTHROID, LEVOTHROID) 88 MCG tablet Take 88 mcg by mouth daily.        . Misc Natural Products (GLUCOS-CHONDROIT-MSM COMPLEX PO) Take 1 tablet by mouth 2 (two) times daily.        . Multiple Vitamins-Minerals (EYE VITAMINS PO) Take 1 tablet by mouth 2 (two) times daily.        Marland Kitchen NIFEdipine (PROCARDIA) 10 MG capsule Take 10 mg by mouth as directed.      Marland Kitchen SIMVASTATIN PO Take 40 mg by mouth.       . traMADol (ULTRAM) 50 MG tablet Take 50 mg by mouth every 6 (six) hours as needed. Maximum dose= 8 tablets per day  For pain       . Valsartan (DIOVAN PO) Take 160 mg by mouth.         Past Medical History  Diagnosis Date  . Hypertension   . Thyroid disease   . High cholesterol     h/o  . Heart murmur   . Myocardial infarction 1995  . Hypothyroidism   . GERD (gastroesophageal reflux disease)   . Constipation   . Hemorrhoid     h/o  . SAH (subarachnoid hemorrhage) 01/12/11    right frontal    Past Surgical History  Procedure Date  . Joint replacement   . Coronary angioplasty   . Total hip arthroplasty     right 1995; left 2000  . Tonsillectomy and adenoidectomy     "when I was little"    No family history on file.  History   Social History  . Marital Status: Widowed    Spouse Name: N/A    Number of Children: N/A  . Years of Education: N/A   Occupational History  . Not on file.   Social History Main Topics  . Smoking status: Never Smoker   . Smokeless tobacco: Never Used   . Alcohol Use: No  . Drug Use: No  . Sexually Active: No   Other Topics Concern  . Not on file   Social History Narrative  . No narrative on file    Review of Systems:  All systems reviewed.  They are negative to the above problem except as previously stated.  Vital Signs: BP 128/64  Pulse 70  Resp 18  Ht 5\' 3"  (1.6 m)  Wt 128 lb 1.9 oz (58.115 kg)  BMI 22.70 kg/m2  Physical Exam  Patient isin NAD  HEENT:  Normocephalic, atraumatic. EOMI, PERRLA.  Neck: JVP is normal. No thyromegaly. No bruits.  Lungs: clear to auscultation. No rales no wheezes.  Heart: Regularly irregular rate and rhythm. Normal S1, S2. No S3.   Gr II/VI systolic murmur   Abdomen:  Supple, nontender. Normal bowel sounds. No masses. No hepatomegaly.  Extremities:   Good distal pulses throughout. No lower extremity edema.  Musculoskeletal :moving all extremities.  Neuro:   alert and oriented x3.  CN II-XII  grossly intact.  EKG:  SInus rhythm.  77 bpm.  Frequent PACs. LBBB     Assessment and Plan:

## 2011-02-23 NOTE — Assessment & Plan Note (Signed)
Will contact Dr. Marca Ancona office for labs.

## 2011-03-04 ENCOUNTER — Telehealth: Payer: Self-pay | Admitting: Internal Medicine

## 2011-03-04 ENCOUNTER — Other Ambulatory Visit: Payer: Self-pay

## 2011-03-04 MED ORDER — VALSARTAN 160 MG PO TABS: 160.0000 mg | ORAL_TABLET | Freq: Every day | ORAL | Status: DC

## 2011-03-04 NOTE — Telephone Encounter (Signed)
New Refill   Patient daughter called to refill patient medicine    Valsartan (DIOVAN PO) Also needs a 90 supply

## 2011-03-13 ENCOUNTER — Telehealth: Payer: Self-pay | Admitting: Internal Medicine

## 2011-03-13 DIAGNOSIS — I10 Essential (primary) hypertension: Secondary | ICD-10-CM

## 2011-03-13 MED ORDER — LOSARTAN POTASSIUM 50 MG PO TABS: 50.0000 mg | ORAL_TABLET | Freq: Every day | ORAL | Status: DC

## 2011-03-13 NOTE — Telephone Encounter (Signed)
Pt needs to change diovan to generic, call to [facility], pt [phone]

## 2011-03-13 NOTE — Telephone Encounter (Signed)
Called patient's daughter and advised that Dr.Ross will change her BP medication to generic Cozaar 50mg  instead of Diovan. Will send into mail order pharmacy.

## 2011-04-21 ENCOUNTER — Ambulatory Visit (INDEPENDENT_AMBULATORY_CARE_PROVIDER_SITE_OTHER): Payer: Medicare Other | Admitting: Family Medicine

## 2011-04-21 ENCOUNTER — Ambulatory Visit: Payer: Medicare Other

## 2011-04-21 VITALS — BP 156/78 | HR 90 | Temp 97.8°F | Resp 16

## 2011-04-21 DIAGNOSIS — M25519 Pain in unspecified shoulder: Secondary | ICD-10-CM

## 2011-04-21 DIAGNOSIS — M25511 Pain in right shoulder: Secondary | ICD-10-CM

## 2011-04-21 DIAGNOSIS — H9193 Unspecified hearing loss, bilateral: Secondary | ICD-10-CM

## 2011-04-21 DIAGNOSIS — H9190 Unspecified hearing loss, unspecified ear: Secondary | ICD-10-CM

## 2011-04-21 NOTE — Patient Instructions (Signed)
Home Safety and Preventing Falls Falls are a leading cause of injury and while they affect all age groups, falls have greater short-term and long-term impact on older age groups. However, falls should not be a part of life or aging. It is possible for individuals and their families to use preventive measures to significantly decrease the likelihood that anyone, especially an older adult, will fall. There are many simple measures which can make your home safer with respect to preventing falls. The following actions can help reduce falls among all members of your family and are especially important as you age, when your balance, lower limb strength, coordination, and eyesight may be declining. The use of preventive measures will help to reduce you and your family's risk of falls and serious medical consequences. OUTDOORS  Repair cracks and edges of walkways and driveways.   Remove high doorway thresholds and trim shrubbery on the main path into your home.   Ensure there is good outside lighting at main entrances and along main walkways.   Clear walkways of tools, rocks, debris, and clutter.   Check that handrails are not broken and are securely fastened. Both sides of steps should have handrails.   In the garage, be attentive to and clean up grease or oil spills on the cement. This can make the surface extremely slippery.   In winter, have leaves, snow, and ice cleared regularly.   Use sand or salt on walkways during winter months.  BATHROOM  Install grab bars by the toilet and in the tub and shower.   Use non-skid mats or decals in the tub or shower.   If unable to easily stand unsupported while showering, place a plastic non slip stool in the shower to sit on when needed.   Install night lights.   Keep floors dry and clean up all water on the floor immediately.   Remove soap buildup in tub or shower on a regular basis.   Secure bath mats with non-slip, double-sided rug tape.    Remove tripping hazards from the floors.  BEDROOMS  Install night lights.   Do not use oversized bedding.   Make sure a bedside light is easy to reach.   Keep a telephone by your bedside.   Make sure that you can get in and out of your bed easily.   Have a firm chair, with side arms, to use for getting dressed.   Remove clutter from around closets.   Store clothing, bed coverings, and other household items where you can reach them comfortably.   Remove tripping hazards from the floor.  LIVING AREAS AND STAIRWAYS  Turn on lights to avoid having to walk through dark areas.   Keep lighting uniform in each room. Place brighter lightbulbs in darker areas, including stairways.   Replace lightbulbs that burn out in stairways immediately.   Arrange furniture to provide for clear pathways.   Keep furniture in the same place.   Eliminate or tape down electrical cables in high traffic areas.   Place handrails on both sides of stairways. Use handrails when going up or down stairs.   Most falls occur on the top or bottom 3 steps.   Fix any loose handrails. Make sure handrails on both sides of the stairways are as long as the stairs.   Remove all walkway obstacles.   Coil or tape electrical cords off to the side of walking areas and out of the way. If using many extension cords, have an electrician   put in a new wall outlet to reduce or eliminate them.   Make sure spills are cleaned up quickly and allow time for drying before walking on freshly cleaned floors.   Firmly attach carpet with non-skid or two-sided tape.   Keep frequently used items within easy reach.   Remove tripping hazards such as throw rugs and clutter in walkways. Never leave objects on stairs.   Get rid of throw rugs elsewhere if possible.   Eliminate uneven floor surfaces.   Make sure couches and chairs are easy to get into and out of.   Check carpeting to make sure it is firmly attached along stairs.    Make repairs to worn or loose carpet promptly.   Select a carpet pattern that does not visually hide the edge of steps.   Avoid placing throw rugs or scatter rugs at the top or bottom of stairways, or properly secure with carpet tape to prevent slippage.   Have an electrician put in a light switch at the top and bottom of the stairs.   Get light switches that glow.   Avoid the following practices: hurrying, inattention, obscured vision, carrying large loads, and wearing slip-on shoes.   Be aware of all pets.  KITCHEN  Place items that are used frequently, such as dishes and food, within easy reach.   Keep handles on pots and pans toward the center of the stove. Use back burners when possible.   Make sure spills are cleaned up quickly and allow time for drying.   Avoid walking on wet floors.   Avoid hot utensils and knives.   Position shelves so they are not too high or low.   Place commonly used objects within easy reach.   If necessary, use a sturdy step stool with a grab bar when reaching.   Make sure electrical cables are out of the way.   Do not use floor polish or wax that makes floors slippery.  OTHER HOME FALL PREVENTION STRATEGIES  Wear low heel or rubber sole shoes that are supportive and fit well.   Wear closed toe shoes.   Know and watch for side effects of medications. Have your caregiver or pharmacist look at all your medicines, even over-the-counter medicines. Some medicines can make you sleepy or dizzy.   Exercise regularly. Exercise makes you stronger and improves your balance and coordination.   Limit use of alcohol.   Use eyeglasses if necessary and keep them clean. Have your vision checked every year.   Organize your household in a manner that minimizes the need to walk distances when hurried, or go up and down stairs unnecessarily. For example, have a phone placed on at least each floor of your home. If possible, have a phone beside each sitting  or lying area where you spend the most time at home. Keep emergency numbers posted at all phones.   Use non-skid floor wax.   When using a ladder, make sure:   The base is firm.   All ladder feet are on level ground.   The ladder is angled against the wall properly.   When climbing a ladder, face the ladder and hold the ladder rungs firmly.   If reaching, always keep your hips and body weight centered between the rails.   When using a stepladder, make sure it is fully opened and both spreaders are firmly locked.   Do not climb a closed stepladder.   Avoid climbing beyond the second step from the top   of a stepladder and the 4th rung from the top of an extension ladder.   Learn and use mobility aids as needed.   Change positions slowly. Arise slowly from sitting and lying positions. Sit on the edge of your bed before getting to your feet.   If you have a history of falls, ask someone to add color or contrast paint or tape to grab bars and handrails in your home.   If you have a history of falls, ask someone to place contrasting color strips on first and last steps.   Install an electrical emergency response system if you need one, and know how to use it.   If you have a medical or other condition that causes you to have limited physical strength, it is important that you reach out to family and friends for occasional help.  FOR CHILDREN:  If young children are in the home, use safety gates. At the top of stairs use screw-mounted gates; use pressure-mounted gates for the bottom of the stairs and doorways between rooms.   Young children should be taught to descend stairs on their stomachs, feet first, and later using the handrail.   Keep drawers fully closed to prevent them from being climbed on or pulled out entirely.   Move chairs, cribs, beds and other furniture away from windows.   Consider installing window guards on windows ground floor and up, unless they are emergency  fire exits. Make sure they have easy release mechanisms.   Consider installing special locks that only allow the window to be opened to a certain height.   Never rely on window screens to prevent falls.   Never leave babies alone on changing tables, beds or sofas. Use a changing table that has a restraining strap.   When a child can pull to a standing position, the crib mattress should be adjusted to its lowest position. There should be at least 26 inches between the top rails of the crib drop side and the mattress. Toys, bumper pads, and other objects that can be used as steps to climb out should be removed from the crib.   On bunk beds never allow a child under age 6 to sleep on the top bunk. For older children, if the upper bunk is not against a wall, use guard rails on both sides. No matter how old a child is, keep the guard rails in place on the top bunk since children roll during sleep. Do not permit horseplay on bunks.   Grass and soil surfaces beneath backyard playground equipment should be replaced with hardwood chips, shredded wood mulch, sand, pea gravel, rubber, crushed stone, or another safer material at depths of at least 9 to 12 inches.   When riding bikes or using skates, skateboards, skis, or snowboards, require children to wear helmets. Look for those that have stickers stating that they meet or exceed safety standards.   Vertical posts or pickets in deck, balcony, and stairway railings should be no more than 3 1/2 inches apart if a young baby will have access to the area. The space between horizontal rails or bars, and between the floor and the first horizontal rail or bar, should be no more than 3 1/2 inches.  Document Released: 01/09/2002 Document Revised: 01/08/2011 Document Reviewed: 11/08/2008 [employer] Patient Information 2012 [employer]. 

## 2011-04-21 NOTE — Progress Notes (Signed)
78 yo woman who fell at 2:15 today when going from TV table back to chair in her house.  She was found by family member and brought in for evaluation.  No loss of consciousness.  Uses a walker normally and shuffles her feet. Room was carpeted Has a h/o bursitis in right hip Has h/o heart murmur (Dr. Dietrich Pates) Has two artificial hips Seen with daughter.    O:  Alert, wearing bilateral hearing aids Arms and shoulders without significant bony abn, ecchymosis, or STS Chest clear Heart:  II/VI systolic murmur of AS Hips nontender Knees:  FROM, nontender  Patient walked in using her walker on 4 wheels. [facility] reading (PRIMARY) by  Dr. Milus Glazier:  Right shoulder negative except for mild arthritis and osteopenia  A:  Acute fall without serious injury P: continue the tramadol as needed

## 2011-07-23 ENCOUNTER — Encounter (HOSPITAL_COMMUNITY): Payer: Self-pay | Admitting: Emergency Medicine

## 2011-07-23 ENCOUNTER — Emergency Department (HOSPITAL_COMMUNITY): Payer: Medicare Other

## 2011-07-23 ENCOUNTER — Emergency Department (HOSPITAL_COMMUNITY)
Admission: EM | Admit: 2011-07-23 | Discharge: 2011-07-23 | Disposition: A | Discharge status: Home / Self Care | Payer: Medicare Other | Attending: Emergency Medicine | Admitting: Emergency Medicine

## 2011-07-23 DIAGNOSIS — I10 Essential (primary) hypertension: Secondary | ICD-10-CM | POA: Insufficient documentation

## 2011-07-23 DIAGNOSIS — E039 Hypothyroidism, unspecified: Secondary | ICD-10-CM | POA: Insufficient documentation

## 2011-07-23 DIAGNOSIS — S0990XA Unspecified injury of head, initial encounter: Secondary | ICD-10-CM | POA: Insufficient documentation

## 2011-07-23 DIAGNOSIS — I252 Old myocardial infarction: Secondary | ICD-10-CM | POA: Insufficient documentation

## 2011-07-23 DIAGNOSIS — K219 Gastro-esophageal reflux disease without esophagitis: Secondary | ICD-10-CM | POA: Insufficient documentation

## 2011-07-23 DIAGNOSIS — Z7982 Long term (current) use of aspirin: Secondary | ICD-10-CM | POA: Insufficient documentation

## 2011-07-23 DIAGNOSIS — R296 Repeated falls: Secondary | ICD-10-CM | POA: Insufficient documentation

## 2011-07-23 DIAGNOSIS — E7800 Pure hypercholesterolemia, unspecified: Secondary | ICD-10-CM | POA: Insufficient documentation

## 2011-07-23 DIAGNOSIS — W19XXXA Unspecified fall, initial encounter: Secondary | ICD-10-CM

## 2011-07-23 NOTE — Discharge Instructions (Signed)
As we discussed, your scans today of the head and neck showed no new findings. You should be observed overnight for signs such as confusion, excess grogginess, vomiting, new trouble ambulating, or trouble moving your arms or legs.     Head Injury, Adult You have had a head injury that does not appear serious at this time. A concussion is a state of changed mental ability, usually from a blow to the head. You should take clear liquids for the rest of the day and then resume your regular diet. You should not take sedatives or alcoholic beverages for as long as directed by your caregiver after discharge. After injuries such as yours, most problems occur within the first 24 hours. SYMPTOMS These minor symptoms may be experienced after discharge:  Memory difficulties.   Dizziness.   Headaches.   Double vision.   Hearing difficulties.   Depression.   Tiredness.   Weakness.   Difficulty with concentration.  If you experience any of these problems, you should not be alarmed. A concussion requires a few days for recovery. Many patients with head injuries frequently experience such symptoms. Usually, these problems disappear without medical care. If symptoms last for more than one day, notify your caregiver. See your caregiver sooner if symptoms are becoming worse rather than better. HOME CARE INSTRUCTIONS   During the next 24 hours you must stay with someone who can watch you for the warning signs listed below.  Although it is unlikely that serious side effects will occur, you should be aware of signs and symptoms which may necessitate your return to this location. Side effects may occur up to 7 - 10 days following the injury. It is important for you to carefully monitor your condition and contact your caregiver or seek immediate medical attention if there is a change in your condition. SEEK IMMEDIATE MEDICAL CARE IF:   There is confusion or drowsiness.   You can not awaken the injured  person.   There is nausea (feeling sick to your stomach) or continued, forceful vomiting.   You notice dizziness or unsteadiness which is getting worse, or inability to walk.   You have convulsions or unconsciousness.   You experience severe, persistent headaches not relieved by over-the-counter or prescription medicines for pain. (Do not take aspirin as this impairs clotting abilities). Take other pain medications only as directed.   You can not use arms or legs normally.   There is clear or bloody discharge from the nose or ears.  MAKE SURE YOU:   Understand these instructions.   Will watch your condition.   Will get help right away if you are not doing well or get worse.  Document Released: 01/19/2005 Document Revised: 01/08/2011 Document Reviewed: 12/07/2008 [facility] Patient Information 2012 [place], [place].

## 2011-07-23 NOTE — ED Notes (Signed)
Pt voided in bathroom.

## 2011-07-23 NOTE — ED Notes (Addendum)
Pt fell at 1530 in linoleum floor while attempting to feed the dogs. She fell backwards, hit her head. Pt is AAOx4. Pt walks with cane and walker. Pt has a knot at mid back of head bigger than a silver dollar.

## 2011-07-23 NOTE — ED Notes (Addendum)
Pt returned from CT. Resting in bed.

## 2011-07-23 NOTE — ED Provider Notes (Signed)
History     CSN: [id number]  Arrival date & time 07/23/11  1620   First MD Initiated Contact with Patient 07/23/11 1706      Chief Complaint  Patient presents with  . Fall    (Consider location/radiation/quality/duration/timing/severity/associated sxs/prior treatment) The history is provided by the patient.  Pt is a 78 y/o F with PMH HTN, MI, and traumatic SAH in 01/2011 who presents to ED with c/c of fall and head injury today around 3:30 PM. Was attempting to change the water in her dog's bowl when she lost her balance and fell backward from a crouched position, striking her occiput on a linoleum floor. No LOC, full recall of events. Was assisted from the ground and has ambulated (with assistance of walker) per her norm since the fall. At time of interview, denies pain but does note an improving hematoma to the right occiput. Denies visual change, trouble speaking, neck pain, N/V, extremity pain/weakness/numbness. No aggravating or alleviating factors. No prior treatment attempted. Her blood thinners include only 81mg  ASA daily.  Past Medical History  Diagnosis Date  . Hypertension   . Thyroid disease   . High cholesterol     h/o  . Heart murmur   . Myocardial infarction 1995  . Hypothyroidism   . GERD (gastroesophageal reflux disease)   . Constipation   . Hemorrhoid     h/o  . SAH (subarachnoid hemorrhage) 01/12/11    right frontal    Past Surgical History  Procedure Date  . Joint replacement   . Coronary angioplasty   . Total hip arthroplasty     right 1995; left 2000  . Tonsillectomy and adenoidectomy     "when I was little"    No family history on file.  History  Substance Use Topics  . Smoking status: Never Smoker   . Smokeless tobacco: Never Used  . Alcohol Use: No     Review of Systems 10 systems reviewed and are negative for acute change except as noted in the HPI.  Allergies  Review of patient's allergies indicates no known allergies.  Home  Medications   Current Outpatient Rx  Name Route Sig Dispense Refill  . ACETAMINOPHEN 500 MG PO TABS Oral Take 500 mg by mouth as needed. Pain.    . ASPIRIN 81 MG PO TABS Oral Take 81 mg by mouth daily.    Marland Kitchen CALCIUM CARBONATE-VITAMIN D 600-400 MG-UNIT PO TABS Oral Take 1 tablet by mouth 2 (two) times daily.      Marland Kitchen LEVOTHYROXINE SODIUM 88 MCG PO TABS Oral Take 88 mcg by mouth daily.      Marland Kitchen LOSARTAN POTASSIUM 50 MG PO TABS Oral Take 50 mg by mouth daily.    Marland Kitchen GLUCOS-CHONDROIT-MSM COMPLEX PO Oral Take 1 tablet by mouth 2 (two) times daily.      Marland Kitchen EYE VITAMINS PO Oral Take 1 tablet by mouth 2 (two) times daily.      Marland Kitchen NIFEDIPINE 10 MG PO CAPS Oral Take 10 mg by mouth daily.     Marland Kitchen SIMVASTATIN 40 MG PO TABS Oral Take 40 mg by mouth every evening.    Marland Kitchen TRAMADOL HCL 50 MG PO TABS Oral Take 50 mg by mouth every 6 (six) hours as needed. Maximum dose= 8 tablets per day  For pain      BP 167/58  Pulse 86  Resp 21  SpO2 97%  Physical Exam  Nursing note reviewed. Constitutional: She is oriented to person, place, and time.  She appears well-developed and well-nourished. No distress.       VS reviewed, sig for HTN  HENT:  Head: Normocephalic.       Small minimally tender hematoma to right occiput  Eyes: Conjunctivae and EOM are normal. Pupils are equal, round, and reactive to light.       Visual fields full  Neck: No spinous process tenderness and no muscular tenderness present.  Cardiovascular: Normal rate and regular rhythm.  Friction rub: systolic.   Murmur heard.      Bilateral radial and DP pulses are 2+   Pulmonary/Chest: Effort normal and breath sounds normal. No respiratory distress. She has no wheezes. She exhibits no tenderness.  Abdominal: Soft. Bowel sounds are normal. She exhibits no distension. There is no tenderness.  Musculoskeletal: She exhibits no edema and no tenderness.       No pain to palpation of the entire spine, no stepoff. No paraspinal TTP. Pelvis stable without pain on  movement.   Neurological: She is alert and oriented to person, place, and time. No cranial nerve deficit (3-12 intact) or sensory deficit (intact to light touch in all extremities distally). Coordination (F-N intact) normal. GCS eye subscore is 4. GCS verbal subscore is 5. GCS motor subscore is 6.       5/5 strength to all extremities. Speech clear.  Skin: Skin is warm and dry.  Psychiatric: She has a normal mood and affect.    ED Course  Procedures (including critical care time)  Labs Reviewed - No data to display Ct Head Wo Contrast  07/23/2011  *RADIOLOGY REPORT*  Clinical Data:  Head injury, fall, hematoma and back of head, headaches, neck pain, history hypertension, MI, prior subarachnoid hemorrhage  CT HEAD WITHOUT CONTRAST CT CERVICAL SPINE WITHOUT CONTRAST  Technique:  Multidetector CT imaging of the head and cervical spine was performed following the standard protocol without intravenous contrast.  Multiplanar CT image reconstructions of the cervical spine were also generated.  Comparison:  01/12/2011  CT HEAD  Findings: Generalized atrophy. Normal ventricular morphology. No midline shift or mass effect. Small vessel chronic ischemic changes of deep cerebral white matter. Benign basal ganglia calcifications. No intracranial hemorrhage, mass lesion or evidence of acute infarction. Small amount of subarachnoid hemorrhage identified at the right frontal region on the previous exam is no longer identified. No acute extra-axial fluid collections. Atherosclerotic calcification of internal carotid and vertebral arteries at skull base. Chronic dependent fluid or mucosal thickening within the sphenoid sinus. Visualized paranasal sinuses and mastoid air cells otherwise clear. Skull intact.  IMPRESSION: Atrophy with small vessel chronic ischemic changes of deep cerebral white matter. No acute intracranial abnormalities. Chronic sphenoid sinus changes.  CT CERVICAL SPINE  Findings: Bones appear  demineralized. Multilevel disc space narrowing and endplate spur formation. Reversal of cervical lordosis and cervical thoracic scoliosis. Multilevel facet degenerative changes. Question ankylosis of posterior elements at C3-C5. Extensive atherosclerotic calcifications within the carotid systems and aortic arch. Visualized skull base intact. Lung apices clear.  Prominent pannus with calcification adjacent to odontoid process. Encroachment upon multiple bilateral cervical neural foramina by uncovertebral spurs, greatest at left C5-C6 and C6-C7. Minimal retrolisthesis at C2-C3, question due to degenerative disc and facet disease. No definite acute fracture, additional subluxation or bone destruction.  IMPRESSION: Degenerative disc and facet disease changes of the cervical spine as above. No definite acute cervical spine abnormalities.  Original Report Authenticated By: Lollie Marrow, M.D.   Ct Cervical Spine Wo Contrast  07/23/2011  *RADIOLOGY  REPORT*  Clinical Data:  Head injury, fall, hematoma and back of head, headaches, neck pain, history hypertension, MI, prior subarachnoid hemorrhage  CT HEAD WITHOUT CONTRAST CT CERVICAL SPINE WITHOUT CONTRAST  Technique:  Multidetector CT imaging of the head and cervical spine was performed following the standard protocol without intravenous contrast.  Multiplanar CT image reconstructions of the cervical spine were also generated.  Comparison:  01/12/2011  CT HEAD  Findings: Generalized atrophy. Normal ventricular morphology. No midline shift or mass effect. Small vessel chronic ischemic changes of deep cerebral white matter. Benign basal ganglia calcifications. No intracranial hemorrhage, mass lesion or evidence of acute infarction. Small amount of subarachnoid hemorrhage identified at the right frontal region on the previous exam is no longer identified. No acute extra-axial fluid collections. Atherosclerotic calcification of internal carotid and vertebral arteries at  skull base. Chronic dependent fluid or mucosal thickening within the sphenoid sinus. Visualized paranasal sinuses and mastoid air cells otherwise clear. Skull intact.  IMPRESSION: Atrophy with small vessel chronic ischemic changes of deep cerebral white matter. No acute intracranial abnormalities. Chronic sphenoid sinus changes.  CT CERVICAL SPINE  Findings: Bones appear demineralized. Multilevel disc space narrowing and endplate spur formation. Reversal of cervical lordosis and cervical thoracic scoliosis. Multilevel facet degenerative changes. Question ankylosis of posterior elements at C3-C5. Extensive atherosclerotic calcifications within the carotid systems and aortic arch. Visualized skull base intact. Lung apices clear.  Prominent pannus with calcification adjacent to odontoid process. Encroachment upon multiple bilateral cervical neural foramina by uncovertebral spurs, greatest at left C5-C6 and C6-C7. Minimal retrolisthesis at C2-C3, question due to degenerative disc and facet disease. No definite acute fracture, additional subluxation or bone destruction.  IMPRESSION: Degenerative disc and facet disease changes of the cervical spine as above. No definite acute cervical spine abnormalities.  Original Report Authenticated By: Lollie Marrow, M.D.     1. Fall   2. Minor head injury       MDM  Mechanical fall in elderly female with only 81mg  ASA anticoagulation. Pt alert and oriented on exam with no motor or neuro deficits identified. Given age, hx trauamtic SAH with fall from standing, CT scan of the head was ordered as well as cervical spine CT. Imaging studies are reviewed and demonstrate no acute findings. Specifically, there is no SDH, SAH. No skull fx or cervical spine fx. Patient is discussed with and also evaluated by EDP. Results discussed with pt and daughter (who will observe overnight). She will be d/c home.         Shaaron Adler, PA-C 07/23/11 2020

## 2011-07-23 NOTE — ED Provider Notes (Signed)
Medical screening examination/treatment/procedure(s) were conducted as a shared visit with non-physician practitioner(s) and myself.  I personally evaluated the patient during the encounter   Patient describes in detail, a followup bending over. She's had this previously. She fell backwards, and hit her head. There was no loss of consciousness. She feels like she is back at her baseline and the pain and swelling in her head have improved. She is here with her daughter, who can watch her tonight. Her imaging studies are reassuring. On exam; she is alert and oriented x3. Neck is normal. Range of motion. Back is nontender to palpation. Ribs are nontender and lungs are clear.  Flint Melter, MD 07/24/11 0000

## 2011-07-23 NOTE — ED Notes (Signed)
Patient transported to CT 

## 2012-03-11 ENCOUNTER — Ambulatory Visit (INDEPENDENT_AMBULATORY_CARE_PROVIDER_SITE_OTHER): Payer: Medicare Other | Admitting: Internal Medicine

## 2012-03-11 ENCOUNTER — Encounter: Payer: Self-pay | Admitting: Internal Medicine

## 2012-03-11 VITALS — BP 150/63 | HR 81 | Ht 63.0 in | Wt 140.0 lb

## 2012-03-11 DIAGNOSIS — I10 Essential (primary) hypertension: Secondary | ICD-10-CM

## 2012-03-11 DIAGNOSIS — I350 Nonrheumatic aortic (valve) stenosis: Secondary | ICD-10-CM

## 2012-03-11 DIAGNOSIS — I359 Nonrheumatic aortic valve disorder, unspecified: Secondary | ICD-10-CM

## 2012-03-11 MED ORDER — SIMVASTATIN 40 MG PO TABS: 40.0000 mg | ORAL_TABLET | Freq: Every evening | ORAL | Status: AC

## 2012-03-11 MED ORDER — NIFEDIPINE ER 90 MG PO TB24: 90.0000 mg | ORAL_TABLET | Freq: Every day | ORAL | Status: AC

## 2012-03-11 MED ORDER — LOSARTAN POTASSIUM 50 MG PO TABS: 50.0000 mg | ORAL_TABLET | Freq: Every day | ORAL | Status: DC

## 2012-03-11 NOTE — Progress Notes (Signed)
HPI Patient is a 79 year old with a history of HTN and mild aortic stenosis. She was previously seen by S. Tennant.  l saw her last year. Since seen she denies CP  She takes activity as tolerated.  Breathing is unchanged.   No Known Allergies  Current Outpatient Prescriptions  Medication Sig Dispense Refill  . acetaminophen (TYLENOL) 500 MG tablet Take 500 mg by mouth as needed. Pain.      Marland Kitchen aspirin 81 MG tablet Take 81 mg by mouth daily.      . Calcium Carbonate-Vitamin D (CALTRATE 600+D) 600-400 MG-UNIT per tablet Take 1 tablet by mouth 2 (two) times daily.        Marland Kitchen levothyroxine (SYNTHROID, LEVOTHROID) 88 MCG tablet Take 75 mcg by mouth daily.       Marland Kitchen losartan (COZAAR) 50 MG tablet Take 50 mg by mouth daily.      . Misc Natural Products (GLUCOS-CHONDROIT-MSM COMPLEX PO) Take 1 tablet by mouth 2 (two) times daily.        . Multiple Vitamins-Minerals (EYE VITAMINS PO) Take 1 tablet by mouth 2 (two) times daily.        Marland Kitchen NIFEdipine (ADALAT CC) 90 MG 24 hr tablet Take 90 mg by mouth daily.      . simvastatin (ZOCOR) 40 MG tablet Take 40 mg by mouth every evening.      . traMADol (ULTRAM) 50 MG tablet Take 50 mg by mouth every 6 (six) hours as needed. Maximum dose= 8 tablets per day  For pain        Past Medical History  Diagnosis Date  . Hypertension   . Thyroid disease   . High cholesterol     h/o  . Heart murmur   . Myocardial infarction 1995  . Hypothyroidism   . GERD (gastroesophageal reflux disease)   . Constipation   . Hemorrhoid     h/o  . SAH (subarachnoid hemorrhage) 01/12/11    right frontal    Past Surgical History  Procedure Date  . Joint replacement   . Coronary angioplasty   . Total hip arthroplasty     right 1995; left 2000  . Tonsillectomy and adenoidectomy     "when I was little"    No family history on file.  History   Social History  . Marital Status: Widowed    Spouse Name: N/A    Number of Children: N/A  . Years of Education: N/A    Occupational History  . Not on file.   Social History Main Topics  . Smoking status: Never Smoker   . Smokeless tobacco: Never Used  . Alcohol Use: No  . Drug Use: No  . Sexually Active: No   Other Topics Concern  . Not on file   Social History Narrative  . No narrative on file    Review of Systems:  All systems reviewed.  They are negative to the above problem except as previously stated.  Vital Signs: BP 150/63  Pulse 81  Ht 5\' 3"  (1.6 m)  Wt 140 lb (63.504 kg)  BMI 24.80 kg/m2  Physical Exam Patient is in NAD  Examined in chair. HEENT:  Normocephalic, atraumatic. EOMI, PERRLA.  Neck: JVP is normal.  No bruits.  Lungs: clear to auscultation. No rales no wheezes.  Heart: Regular rate and rhythm. Normal S1, S2. No S3.   Gr II/VI systolic murmur. PMI not displaced.  Abdomen:  Supple, nontender. Normal bowel sounds. No masses. No hepatomegaly.  Extremities:   Good distal pulses throughout. No lower extremity edema.  Musculoskeletal :moving all extremities.  Neuro:   alert and oriented x3.  CN II-XII grossly intact.   Assessment and Plan:  1.  HTN  Follow  I do not want to push further for now given age.  Follow  2.  AS  Murmur suggest mild to moderate, not severe.   I would follow.  3.  HCM  Will get labs from Dr Zachery Dauer re lipids.  F/U next year

## 2012-03-11 NOTE — Patient Instructions (Addendum)
Your physician wants you to follow-up in: 1 year with Dr. Ross.  You will receive a reminder letter in the mail two months in advance. If you don't receive a letter, please call our office to schedule the follow-up appointment.  

## 2012-07-19 ENCOUNTER — Emergency Department (HOSPITAL_COMMUNITY): Payer: Medicare Other

## 2012-07-19 ENCOUNTER — Emergency Department (HOSPITAL_COMMUNITY)
Admission: EM | Admit: 2012-07-19 | Discharge: 2012-07-19 | Disposition: A | Discharge status: Home / Self Care | Payer: Medicare Other | Attending: Emergency Medicine | Admitting: Emergency Medicine

## 2012-07-19 ENCOUNTER — Encounter (HOSPITAL_COMMUNITY): Payer: Self-pay | Admitting: Emergency Medicine

## 2012-07-19 DIAGNOSIS — H9209 Otalgia, unspecified ear: Secondary | ICD-10-CM | POA: Insufficient documentation

## 2012-07-19 DIAGNOSIS — Z8679 Personal history of other diseases of the circulatory system: Secondary | ICD-10-CM | POA: Insufficient documentation

## 2012-07-19 DIAGNOSIS — M25559 Pain in unspecified hip: Secondary | ICD-10-CM | POA: Insufficient documentation

## 2012-07-19 DIAGNOSIS — I219 Acute myocardial infarction, unspecified: Secondary | ICD-10-CM | POA: Insufficient documentation

## 2012-07-19 DIAGNOSIS — I10 Essential (primary) hypertension: Secondary | ICD-10-CM | POA: Insufficient documentation

## 2012-07-19 DIAGNOSIS — Z79899 Other long term (current) drug therapy: Secondary | ICD-10-CM | POA: Insufficient documentation

## 2012-07-19 DIAGNOSIS — Z7982 Long term (current) use of aspirin: Secondary | ICD-10-CM | POA: Insufficient documentation

## 2012-07-19 DIAGNOSIS — R011 Cardiac murmur, unspecified: Secondary | ICD-10-CM | POA: Insufficient documentation

## 2012-07-19 DIAGNOSIS — Z8719 Personal history of other diseases of the digestive system: Secondary | ICD-10-CM | POA: Insufficient documentation

## 2012-07-19 DIAGNOSIS — E039 Hypothyroidism, unspecified: Secondary | ICD-10-CM | POA: Insufficient documentation

## 2012-07-19 DIAGNOSIS — M2550 Pain in unspecified joint: Secondary | ICD-10-CM | POA: Insufficient documentation

## 2012-07-19 DIAGNOSIS — E7800 Pure hypercholesterolemia, unspecified: Secondary | ICD-10-CM | POA: Insufficient documentation

## 2012-07-19 DIAGNOSIS — E079 Disorder of thyroid, unspecified: Secondary | ICD-10-CM | POA: Insufficient documentation

## 2012-07-19 NOTE — ED Notes (Signed)
[id number]<YQ> Expected date:<BR> Expected time:<BR> Means of arrival:<BR> Comments:<BR> 96 lt hip pain

## 2012-07-19 NOTE — ED Provider Notes (Signed)
History     CSN: [id number]  Arrival date & time 07/19/12  1005   First MD Initiated Contact with Patient 07/19/12 1008      Chief Complaint  Patient presents with  . Hip Pain    (Consider location/radiation/quality/duration/timing/severity/associated sxs/prior treatment) HPI Comments: Patient presents to ER for evaluation of left ear pain. Patient reports that she woke up this morning and walked to the bathroom. When she was walking back to the bathroom she had onset of sharp stabbing pain in the left hip that shot down the leg. She laid down on the bed and the pain got better. Patient denies fall. At arrival to the ER she has minimal pain while lying in bed.  Patient is a 79 y.o. female presenting with hip pain.  Hip Pain    Past Medical History  Diagnosis Date  . Hypertension   . Thyroid disease   . High cholesterol     h/o  . Heart murmur   . Myocardial infarction 1995  . Hypothyroidism   . GERD (gastroesophageal reflux disease)   . Constipation   . Hemorrhoid     h/o  . SAH (subarachnoid hemorrhage) 01/12/11    right frontal    Past Surgical History  Procedure Laterality Date  . Joint replacement    . Coronary angioplasty    . Total hip arthroplasty      right 1995; left 2000  . Tonsillectomy and adenoidectomy      "when I was little"    No family history on file.  History  Substance Use Topics  . Smoking status: Never Smoker   . Smokeless tobacco: Never Used  . Alcohol Use: No    OB History   Grav Para Term Preterm Abortions TAB SAB Ect Mult Living                  Review of Systems  Musculoskeletal: Positive for arthralgias. Negative for back pain.  All other systems reviewed and are negative.    Allergies  Review of patient's allergies indicates no known allergies.  Home Medications   Current Outpatient Rx  Name  Route  Sig  Dispense  Refill  . acetaminophen (TYLENOL) 500 MG tablet   Oral   Take 500 mg by mouth as needed. Pain.        Marland Kitchen aspirin 81 MG tablet   Oral   Take 81 mg by mouth daily.         . Calcium Carbonate-Vitamin D (CALTRATE 600+D) 600-400 MG-UNIT per tablet   Oral   Take 1 tablet by mouth 2 (two) times daily.           Marland Kitchen levothyroxine (SYNTHROID, LEVOTHROID) 88 MCG tablet   Oral   Take 75 mcg by mouth daily.          Marland Kitchen losartan (COZAAR) 50 MG tablet   Oral   Take 1 tablet (50 mg total) by mouth daily.   90 tablet   3   . Misc Natural Products (GLUCOS-CHONDROIT-MSM COMPLEX PO)   Oral   Take 1 tablet by mouth 2 (two) times daily.           . Multiple Vitamins-Minerals (EYE VITAMINS PO)   Oral   Take 1 tablet by mouth 2 (two) times daily.           Marland Kitchen NIFEdipine (ADALAT CC) 90 MG 24 hr tablet   Oral   Take 1 tablet (90 mg total) by mouth daily.  90 tablet   3   . simvastatin (ZOCOR) 40 MG tablet   Oral   Take 1 tablet (40 mg total) by mouth every evening.   90 tablet   3   . traMADol (ULTRAM) 50 MG tablet   Oral   Take 50 mg by mouth every 6 (six) hours as needed. Maximum dose= 8 tablets per day  For pain           BP 185/66  Pulse 79  Temp(Src) 97.5 F (36.4 C) (Oral)  Resp 16  SpO2 98%  Physical Exam  Constitutional: She is oriented to person, place, and time. She appears well-developed and well-nourished. No distress.  HENT:  Head: Normocephalic and atraumatic.  Right Ear: Hearing normal.  Left Ear: Hearing normal.  Nose: Nose normal.  Mouth/Throat: Oropharynx is clear and moist and mucous membranes are normal.  Eyes: Conjunctivae and EOM are normal. Pupils are equal, round, and reactive to light.  Neck: Normal range of motion. Neck supple.  Cardiovascular: Regular rhythm, S1 normal and S2 normal.  Exam reveals no gallop and no friction rub.   No murmur heard. Pulmonary/Chest: Effort normal and breath sounds normal. No respiratory distress. She exhibits no tenderness.  Abdominal: Soft. Normal appearance and bowel sounds are normal. There is no  hepatosplenomegaly. There is no tenderness. There is no rebound, no guarding, no tenderness at McBurney's point and negative Murphy's sign. No hernia.  Musculoskeletal:       Left hip: She exhibits decreased range of motion and tenderness.  Neurological: She is alert and oriented to person, place, and time. She has normal strength. No cranial nerve deficit or sensory deficit. Coordination normal. GCS eye subscore is 4. GCS verbal subscore is 5. GCS motor subscore is 6.  Skin: Skin is warm, dry and intact. No rash noted. No cyanosis.  Psychiatric: She has a normal mood and affect. Her speech is normal and behavior is normal. Thought content normal.    ED Course  Procedures (including critical care time)  Labs Reviewed - No data to display Dg Hip Complete Left  07/19/2012   *RADIOLOGY REPORT*  Clinical Data: Left hip pain.  No known injury.  LEFT HIP - COMPLETE 2+ VIEW  Comparison: None.  Findings: Bilateral total hip prostheses in satisfactory position and alignment.  No fracture or dislocation.  No periprosthetic lucency.  Rounded calcification in the right pelvis compatible with a calcified uterine fibroid.  Diffuse osteopenia.  Atheromatous arterial calcifications.  IMPRESSION: Satisfactory appearance of a left total hip prosthesis.   Original Report Authenticated By: Beckie Salts, M.D.     Diagnosis: Joint pain, resolved    MDM  Patient had severe pain in left hip area earlier today at [facility] to hopefully back to bed. She did not fall. Patient was concerned that her prosthetic hip might have been out of place. X-ray shows no acute abnormality. Patient's pain has resolved before coming to the ER. She is ambulating without difficulty here in the ER.        Gilda Crease, MD 07/19/12 1236

## 2012-07-19 NOTE — ED Notes (Signed)
Pt ambulated to bathroom with assist.

## 2012-07-19 NOTE — ED Notes (Addendum)
Per EMS-pt c/o of left hip pain while walking this am. Bilateral hip replacements. Also c/o of numbness bilaterally from knee down. Pain 7/10. Pt on blood thinner. No obvious deformity noted. NAD at this time.

## 2013-03-27 ENCOUNTER — Ambulatory Visit (INDEPENDENT_AMBULATORY_CARE_PROVIDER_SITE_OTHER): Payer: Medicare Other | Admitting: Internal Medicine

## 2013-03-27 VITALS — BP 138/63 | HR 87 | Ht 63.0 in | Wt 138.0 lb

## 2013-03-27 DIAGNOSIS — I359 Nonrheumatic aortic valve disorder, unspecified: Secondary | ICD-10-CM

## 2013-03-27 DIAGNOSIS — I350 Nonrheumatic aortic (valve) stenosis: Secondary | ICD-10-CM

## 2013-03-27 DIAGNOSIS — I10 Essential (primary) hypertension: Secondary | ICD-10-CM

## 2013-03-27 NOTE — Patient Instructions (Signed)
Your physician recommends that you continue on your current medications as directed. Please refer to the Current Medication list given to you today.  Your physician wants you to follow-up in: 1 year You will receive a reminder letter in the mail two months in advance. If you don't receive a letter, please call our office to schedule the follow-up appointment.  We will request your recent lab work from your pcp Dr.Barnes

## 2013-03-27 NOTE — Progress Notes (Addendum)
HPI Patient is a 80 year old with a history of HTN and  aortic stenosis. She was previously seen by S. Tennant.  l saw her last year. Since seen she denies CP  Breathing is Ok  No PND  No dizziness/presyncope.  Does get SOB if pushes activities.     No Known Allergies  Current Outpatient Prescriptions  Medication Sig Dispense Refill  . acetaminophen (TYLENOL) 500 MG tablet Take 500 mg by mouth as needed. Pain.      Marland Kitchen. aspirin 81 MG tablet Take 81 mg by mouth daily.      Marland Kitchen. levothyroxine (SYNTHROID, LEVOTHROID) 75 MCG tablet Take 75 mcg by mouth daily before breakfast.      . losartan (COZAAR) 50 MG tablet Take 1 tablet (50 mg total) by mouth daily.  90 tablet  3  . Misc Natural Products (GLUCOS-CHONDROIT-MSM COMPLEX PO) Take 1 tablet by mouth 2 (two) times daily.        . Multiple Vitamins-Minerals (EYE VITAMINS PO) Take 1 tablet by mouth daily.       Marland Kitchen. NIFEdipine (ADALAT CC) 90 MG 24 hr tablet Take 1 tablet (90 mg total) by mouth daily.  90 tablet  3  . simvastatin (ZOCOR) 40 MG tablet Take 1 tablet (40 mg total) by mouth every evening.  90 tablet  3  . traMADol (ULTRAM) 50 MG tablet Take 50 mg by mouth every 6 (six) hours as needed. Maximum dose= 8 tablets per day  For pain       No current facility-administered medications for this visit.    Past Medical History  Diagnosis Date  . Hypertension   . Thyroid disease   . High cholesterol     h/o  . Heart murmur   . Myocardial infarction 1995  . Hypothyroidism   . GERD (gastroesophageal reflux disease)   . Constipation   . Hemorrhoid     h/o  . SAH (subarachnoid hemorrhage) 01/12/11    right frontal    Past Surgical History  Procedure Laterality Date  . Joint replacement    . Coronary angioplasty    . Total hip arthroplasty      right 1995; left 2000  . Tonsillectomy and adenoidectomy      "when I was little"    No family history on file.  History   Social History  . Marital Status: Widowed    Spouse Name: N/A   Number of Children: N/A  . Years of Education: N/A   Occupational History  . Not on file.   Social History Main Topics  . Smoking status: Never Smoker   . Smokeless tobacco: Never Used  . Alcohol Use: No  . Drug Use: No  . Sexual Activity: No   Other Topics Concern  . Not on file   Social History Narrative  . No narrative on file    Review of Systems:  All systems reviewed.  They are negative to the above problem except as previously stated.  Vital Signs: BP 138/63  Pulse 87  Ht 5\' 3"  (1.6 m)  Wt 138 lb (62.596 kg)  BMI 24.45 kg/m2  Physical Exam Patient is in NAD  Examined in chair. HEENT:  Normocephalic, atraumatic. EOMI, PERRLA.  Neck: JVP is normal.  No bruits.  Lungs: clear to auscultation. No rales no wheezes.  Heart: Regular rate and rhythm. Normal S1, S2. No S3.   Gr III/VI systolic murmur. PMI not displaced.  Abdomen:  Supple, nontender. Normal bowel sounds.  No masses. No hepatomegaly.  Extremities:   Good distal pulses throughout. No lower extremity edema.  Musculoskeletal :moving all extremities.  Neuro:   alert and oriented x3.  CN II-XII grossly intact.  SR 87  Septal MI  Incomp LBBB Assessment and Plan:  1.  HTN  Follow  Adequate control    2.  AS  Murmur suggest stenosis is  moderate, not severe.   I would follow.  Remains rel asymptomatic.  3.  HCM  Will get labs from Dr Zachery Dauer re lipids.  F/U next year

## 2013-10-30 ENCOUNTER — Emergency Department (HOSPITAL_COMMUNITY): Payer: Medicare Other

## 2013-10-30 ENCOUNTER — Encounter (HOSPITAL_COMMUNITY): Payer: Self-pay | Admitting: Emergency Medicine

## 2013-10-30 ENCOUNTER — Emergency Department (HOSPITAL_COMMUNITY)
Admission: EM | Admit: 2013-10-30 | Discharge: 2013-10-30 | Disposition: A | Discharge status: Home / Self Care | Payer: Medicare Other | Attending: Emergency Medicine | Admitting: Emergency Medicine

## 2013-10-30 DIAGNOSIS — I10 Essential (primary) hypertension: Secondary | ICD-10-CM | POA: Insufficient documentation

## 2013-10-30 DIAGNOSIS — Z79899 Other long term (current) drug therapy: Secondary | ICD-10-CM | POA: Insufficient documentation

## 2013-10-30 DIAGNOSIS — R4182 Altered mental status, unspecified: Secondary | ICD-10-CM | POA: Insufficient documentation

## 2013-10-30 DIAGNOSIS — Z9861 Coronary angioplasty status: Secondary | ICD-10-CM | POA: Diagnosis not present

## 2013-10-30 DIAGNOSIS — N3000 Acute cystitis without hematuria: Secondary | ICD-10-CM | POA: Diagnosis not present

## 2013-10-30 DIAGNOSIS — K5900 Constipation, unspecified: Secondary | ICD-10-CM | POA: Diagnosis not present

## 2013-10-30 DIAGNOSIS — R011 Cardiac murmur, unspecified: Secondary | ICD-10-CM | POA: Diagnosis not present

## 2013-10-30 DIAGNOSIS — R410 Disorientation, unspecified: Secondary | ICD-10-CM

## 2013-10-30 DIAGNOSIS — E7800 Pure hypercholesterolemia, unspecified: Secondary | ICD-10-CM | POA: Diagnosis not present

## 2013-10-30 DIAGNOSIS — I252 Old myocardial infarction: Secondary | ICD-10-CM | POA: Insufficient documentation

## 2013-10-30 DIAGNOSIS — E039 Hypothyroidism, unspecified: Secondary | ICD-10-CM | POA: Insufficient documentation

## 2013-10-30 DIAGNOSIS — F29 Unspecified psychosis not due to a substance or known physiological condition: Secondary | ICD-10-CM | POA: Diagnosis not present

## 2013-10-30 LAB — URINALYSIS, ROUTINE W REFLEX MICROSCOPIC
Bilirubin Urine: NEGATIVE
Glucose, UA: NEGATIVE mg/dL
Hgb urine dipstick: NEGATIVE
Ketones, ur: NEGATIVE mg/dL
Nitrite: NEGATIVE
Protein, ur: NEGATIVE mg/dL
Specific Gravity, Urine: 1.024 (ref 1.005–1.030)
UROBILINOGEN UA: 1 mg/dL (ref 0.0–1.0)
pH: 5.5 (ref 5.0–8.0)

## 2013-10-30 LAB — COMPREHENSIVE METABOLIC PANEL
ALBUMIN: 4.1 g/dL (ref 3.5–5.2)
ALT: 8 U/L (ref 0–35)
ANION GAP: 15 (ref 5–15)
AST: 12 U/L (ref 0–37)
Alkaline Phosphatase: 61 U/L (ref 39–117)
BILIRUBIN TOTAL: 0.4 mg/dL (ref 0.3–1.2)
BUN: 21 mg/dL (ref 6–23)
CO2: 24 mEq/L (ref 19–32)
CREATININE: 0.74 mg/dL (ref 0.50–1.10)
Calcium: 9 mg/dL (ref 8.4–10.5)
Chloride: 104 mEq/L (ref 96–112)
GFR calc Af Amer: 82 mL/min — ABNORMAL LOW (ref >90)
GFR calc non Af Amer: 71 mL/min — ABNORMAL LOW (ref >90)
Glucose, Bld: 160 mg/dL — ABNORMAL HIGH (ref 70–99)
Potassium: 4.2 mEq/L (ref 3.7–5.3)
Sodium: 143 mEq/L (ref 137–147)
TOTAL PROTEIN: 7.2 g/dL (ref 6.0–8.3)

## 2013-10-30 LAB — URINE MICROSCOPIC-ADD ON

## 2013-10-30 LAB — CBC
HEMATOCRIT: 44.2 % (ref 36.0–46.0)
Hemoglobin: 14.3 g/dL (ref 12.0–15.0)
MCH: 32.2 pg (ref 26.0–34.0)
MCHC: 32.4 g/dL (ref 30.0–36.0)
MCV: 99.5 fL (ref 78.0–100.0)
Platelets: 337 10*3/uL (ref 150–400)
RBC: 4.44 MIL/uL (ref 3.87–5.11)
RDW: 13.8 % (ref 11.5–15.5)
WBC: 10.6 10*3/uL — ABNORMAL HIGH (ref 4.0–10.5)

## 2013-10-30 MED ORDER — CEPHALEXIN 500 MG PO CAPS
500.0000 mg | ORAL_CAPSULE | Freq: Once | ORAL | Status: AC
  Administered 2013-10-30: 500 mg via ORAL
  Filled 2013-10-30: qty 1

## 2013-10-30 MED ORDER — NITROFURANTOIN MONOHYD MACRO 100 MG PO CAPS: 100.0000 mg | ORAL_CAPSULE | Freq: Once | ORAL | Status: DC

## 2013-10-30 MED ORDER — CEPHALEXIN 500 MG PO CAPS: 500.0000 mg | ORAL_CAPSULE | Freq: Four times a day (QID) | ORAL | Status: DC

## 2013-10-30 NOTE — ED Notes (Signed)
Per family member, states patient fell on Sept 11th-hit head-was never evaluated-since then increased confusion

## 2013-10-30 NOTE — Discharge Instructions (Signed)
Altered Mental Status °Altered mental status most often refers to an abnormal change in your responsiveness and awareness. It can affect your speech, thought, mobility, memory, attention span, or alertness. It can range from slight confusion to complete unresponsiveness (coma). Altered mental status can be a sign of a serious underlying medical condition. Rapid evaluation and medical treatment is necessary for patients having an altered mental status. °CAUSES  °· Low blood sugar (hypoglycemia) or diabetes. °· Severe loss of body fluids (dehydration) or a body salt (electrolyte) imbalance. °· A stroke or other neurologic problem, such as dementia or delirium. °· A head injury or tumor. °· A drug or alcohol overdose. °· Exposure to toxins or poisons. °· Depression, anxiety, and stress. °· A low oxygen level (hypoxia). °· An infection. °· Blood loss. °· Twitching or shaking (seizure). °· Heart problems, such as heart attack or heart rhythm problems (arrhythmias). °· A body temperature that is too low or too high (hypothermia or hyperthermia). °DIAGNOSIS  °A diagnosis is based on your history, symptoms, physical and neurologic examinations, and diagnostic tests. Diagnostic tests may include: °· Measurement of your blood pressure, pulse, breathing, and oxygen levels (vital signs). °· Blood tests. °· Urine tests. °· X-ray exams. °· A computerized magnetic scan (magnetic resonance imaging, MRI). °· A computerized X-ray scan (computed tomography, CT scan). °TREATMENT  °Treatment will depend on the cause. Treatment may include: °· Management of an underlying medical or mental health condition. °· Critical care or support in the hospital. °HOME CARE INSTRUCTIONS  °· Only take over-the-counter or prescription medicines for pain, discomfort, or fever as directed by your caregiver. °· Manage underlying conditions as directed by your caregiver. °· Eat a healthy, well-balanced diet to maintain strength. °· Join a support group or  prevention program to cope with the condition or trauma that caused the altered mental status. Ask your caregiver to help choose a program that works for you. °· Follow up with your caregiver for further examination, therapy, or testing as directed. °SEEK MEDICAL CARE IF:  °· You feel unwell or have chills. °· You or your family notice a change in your behavior or your alertness. °· You have trouble following your caregiver's treatment plan. °· You have questions or concerns. °SEEK IMMEDIATE MEDICAL CARE IF:  °· You have a rapid heartbeat or have chest pain. °· You have difficulty breathing. °· You have a fever. °· You have a headache with a stiff neck. °· You cough up blood. °· You have blood in your urine or stool. °· You have severe agitation or confusion. °MAKE SURE YOU:  °· Understand these instructions. °· Will watch your condition. °· Will get help right away if you are not doing well or get worse. °Document Released: 07/09/2009 Document Revised: 04/13/2011 Document Reviewed: 07/09/2009 °ExitCare® Patient Information ©2015 [employer]. This information is not intended to replace advice given to you by your health care provider. Make sure you discuss any questions you have with your health care provider. °Urinary Tract Infection °Urinary tract infections (UTIs) can develop anywhere along your urinary tract. Your urinary tract is your body's drainage system for removing wastes and extra water. Your urinary tract includes two kidneys, two ureters, a bladder, and a urethra. Your kidneys are a pair of bean-shaped organs. Each kidney is about the size of your fist. They are located below your ribs, one on each side of your spine. °CAUSES °Infections are caused by microbes, which are microscopic organisms, including fungi, viruses, and bacteria. These   organisms are so small that they can only be seen through a microscope. Bacteria are the microbes that most commonly cause UTIs. °SYMPTOMS  °Symptoms of UTIs may  vary by age and gender of the patient and by the location of the infection. Symptoms in young women typically include a frequent and intense urge to urinate and a painful, burning feeling in the bladder or urethra during urination. Older women and men are more likely to be tired, shaky, and weak and have muscle aches and abdominal pain. A fever may mean the infection is in your kidneys. Other symptoms of a kidney infection include pain in your back or sides below the ribs, nausea, and vomiting. °DIAGNOSIS °To diagnose a UTI, your caregiver will ask you about your symptoms. Your caregiver also will ask to provide a urine sample. The urine sample will be tested for bacteria and white blood cells. White blood cells are made by your body to help fight infection. °TREATMENT  °Typically, UTIs can be treated with medication. Because most UTIs are caused by a bacterial infection, they usually can be treated with the use of antibiotics. The choice of antibiotic and length of treatment depend on your symptoms and the type of bacteria causing your infection. °HOME CARE INSTRUCTIONS °· If you were prescribed antibiotics, take them exactly as your caregiver instructs you. Finish the medication even if you feel better after you have only taken some of the medication. °· Drink enough water and fluids to keep your urine clear or pale yellow. °· Avoid caffeine, tea, and carbonated beverages. They tend to irritate your bladder. °· Empty your bladder often. Avoid holding urine for long periods of time. °· Empty your bladder before and after sexual intercourse. °· After a bowel movement, women should cleanse from front to back. Use each tissue only once. °SEEK MEDICAL CARE IF:  °· You have back pain. °· You develop a fever. °· Your symptoms do not begin to resolve within 3 days. °SEEK IMMEDIATE MEDICAL CARE IF:  °· You have severe back pain or lower abdominal pain. °· You develop chills. °· You have nausea or vomiting. °· You have  continued burning or discomfort with urination. °MAKE SURE YOU:  °· Understand these instructions. °· Will watch your condition. °· Will get help right away if you are not doing well or get worse. °Document Released: 10/29/2004 Document Revised: 07/21/2011 Document Reviewed: 02/27/2011 °ExitCare® Patient Information ©2015 [employer]. This information is not intended to replace advice given to you by your health care provider. Make sure you discuss any questions you have with your health care provider. ° °

## 2013-10-30 NOTE — ED Provider Notes (Signed)
CSN: [id number]     Arrival date & time 10/30/13  1520 History   First MD Initiated Contact with Patient 10/30/13 1559     Chief Complaint  Patient presents with  . Altered Mental Status     (Consider location/radiation/quality/duration/timing/severity/associated sxs/prior Treatment) Patient is a 80 y.o. female presenting with general illness.  Illness Location:  Generalized Quality:  Confusion Severity:  Moderate Onset quality:  Gradual Timing:  Constant Progression:  Worsening Chronicity:  New Context:  Mechanical fall 9/11 with hit to head Relieved by:  Nothing Worsened by:  Nothing Associated symptoms: no abdominal pain, no chest pain, no congestion, no cough, no diarrhea, no fever, no headaches, no loss of consciousness, no nausea, no rash, no rhinorrhea, no shortness of breath, no sore throat and no vomiting     Past Medical History  Diagnosis Date  . Hypertension   . Thyroid disease   . High cholesterol     h/o  . Heart murmur   . Myocardial infarction 1995  . Hypothyroidism   . GERD (gastroesophageal reflux disease)   . Constipation   . Hemorrhoid     h/o  . SAH (subarachnoid hemorrhage) 01/12/11    right frontal   Past Surgical History  Procedure Laterality Date  . Joint replacement    . Coronary angioplasty    . Total hip arthroplasty      right 1995; left 2000  . Tonsillectomy and adenoidectomy      "when I was little"   No family history on file. History  Substance Use Topics  . Smoking status: Never Smoker   . Smokeless tobacco: Never Used  . Alcohol Use: No   OB History   Grav Para Term Preterm Abortions TAB SAB Ect Mult Living                 Review of Systems  Constitutional: Negative for fever and chills.  HENT: Negative for congestion, rhinorrhea and sore throat.   Eyes: Negative for photophobia and visual disturbance.  Respiratory: Negative for cough and shortness of breath.   Cardiovascular: Negative for chest pain and leg  swelling.  Gastrointestinal: Negative for nausea, vomiting, abdominal pain, diarrhea and constipation.  Endocrine: Negative for polyphagia and polyuria.  Genitourinary: Negative for dysuria, flank pain, vaginal bleeding, vaginal discharge and enuresis.  Musculoskeletal: Negative for back pain and gait problem.  Skin: Negative for color change and rash.  Neurological: Negative for dizziness, loss of consciousness, syncope, light-headedness, numbness and headaches.  Hematological: Negative for adenopathy. Does not bruise/bleed easily.  All other systems reviewed and are negative.     Allergies  Review of patient's allergies indicates no known allergies.  Home Medications   Prior to Admission medications   Medication Sig Start Date End Date Taking? Authorizing Provider  acetaminophen (TYLENOL) 500 MG tablet Take 500 mg by mouth every 6 (six) hours as needed (pain). Pain.   Yes Historical Provider, MD  aspirin 81 MG tablet Take 81 mg by mouth at bedtime and may repeat dose one time if needed.    Yes Historical Provider, MD  levothyroxine (SYNTHROID, LEVOTHROID) 75 MCG tablet Take 75 mcg by mouth daily before breakfast.   Yes Historical Provider, MD  losartan (COZAAR) 50 MG tablet Take 50 mg by mouth daily.   Yes Historical Provider, MD  Misc Natural Products (GLUCOS-CHONDROIT-MSM COMPLEX PO) Take 1 tablet by mouth 2 (two) times daily.     Yes Historical Provider, MD  Multiple Vitamins-Minerals (EYE VITAMINS PO)  Take 1 tablet by mouth daily.    Yes Historical Provider, MD  NIFEdipine (ADALAT CC) 90 MG 24 hr tablet Take 1 tablet (90 mg total) by mouth daily. 03/11/12  Yes Pricilla Riffle, MD  polyethylene glycol [facility] / GLYCOLAX) packet Take 17 g by mouth daily.   Yes Historical Provider, MD  simvastatin (ZOCOR) 40 MG tablet Take 1 tablet (40 mg total) by mouth every evening. 03/11/12  Yes Pricilla Riffle, MD  traMADol (ULTRAM) 50 MG tablet Take 50-100 mg by mouth every 6 (six) hours as needed  (pain). Maximum dose= 8 tablets per day  For pain   Yes Historical Provider, MD  cephALEXin (KEFLEX) 500 MG capsule Take 1 capsule (500 mg total) by mouth 4 (four) times daily. 10/30/13   Mirian Mo, MD  losartan (COZAAR) 50 MG tablet Take 1 tablet (50 mg total) by mouth daily. 03/11/12 03/27/13  Pricilla Riffle, MD   BP 106/78  Pulse 77  Temp(Src) 98.3 F (36.8 C) (Oral)  Resp 18  SpO2 99% Physical Exam  Vitals reviewed. Constitutional: She is oriented to person, place, and time. She appears well-developed and well-nourished.  HENT:  Head: Normocephalic and atraumatic.  Right Ear: External ear normal.  Left Ear: External ear normal.  Eyes: Conjunctivae and EOM are normal. Pupils are equal, round, and reactive to light.  Neck: Normal range of motion. Neck supple.  Cardiovascular: Normal rate, regular rhythm and intact distal pulses.   Murmur heard.  Systolic murmur is present with a grade of 4/6  Pulmonary/Chest: Effort normal and breath sounds normal.  Abdominal: Soft. Bowel sounds are normal. There is no tenderness.  Musculoskeletal: Normal range of motion.  Neurological: She is alert and oriented to person, place, and time. She has normal strength and normal reflexes. No cranial nerve deficit or sensory deficit. Gait normal.  Skin: Skin is warm and dry.    ED Course  Procedures (including critical care time) Labs Review Labs Reviewed  CBC - Abnormal; Notable for the following:    WBC 10.6 (*)    All other components within normal limits  COMPREHENSIVE METABOLIC PANEL - Abnormal; Notable for the following:    Glucose, Bld 160 (*)    GFR calc non Af Amer 71 (*)    GFR calc Af Amer 82 (*)    All other components within normal limits  URINALYSIS, ROUTINE W REFLEX MICROSCOPIC - Abnormal; Notable for the following:    APPearance TURBID (*)    Leukocytes, UA MODERATE (*)    All other components within normal limits  URINE MICROSCOPIC-ADD ON - Abnormal; Notable for the following:     Squamous Epithelial / LPF FEW (*)    Bacteria, UA MANY (*)    All other components within normal limits    Imaging Review Dg Chest 2 View  10/30/2013   CLINICAL DATA:  Altered mental status.  EXAM: CHEST  2 VIEW  COMPARISON:  08/08/2010  FINDINGS: Patient is slightly rotated to the right. Lungs are adequately inflated without consolidation or effusion. Cardiomediastinal silhouette is within normal. There is calcified plaque over the thoracic aorta. There are degenerative changes of the spine.  IMPRESSION: No active cardiopulmonary disease.   Electronically Signed   By: Elberta Fortis M.D.   On: 10/30/2013 18:21   Ct Head Wo Contrast  10/30/2013   CLINICAL DATA:  Status post fall 10/13/2013 with a blow to the head. Increased confusion.  EXAM: CT HEAD WITHOUT CONTRAST  TECHNIQUE: Contiguous  axial images were obtained from the base of the skull through the vertex without intravenous contrast.  COMPARISON:  Head CT scan 07/23/2011.  FINDINGS: There is cortical atrophy and chronic microvascular ischemic change, stable in appearance. No evidence of acute abnormality including infarction, hemorrhage, mass lesion, mass effect, midline shift or abnormal extra-axial fluid collection. No hydrocephalus or pneumocephalus. The calvarium is intact. Mucosal thickening sphenoid sinus is noted and unchanged.  IMPRESSION: No acute intracranial abnormality.  Stable compared to prior exam.   Electronically Signed   By: Drusilla Kanner M.D.   On: 10/30/2013 17:01     EKG Interpretation   Date/Time:  Monday October 30 2013 16:38:57 EDT Ventricular Rate:  78 PR Interval:  155 QRS Duration: 126 QT Interval:  415 QTC Calculation: 473 R Axis:   10 Text Interpretation:  Sinus rhythm Left bundle branch block No significant  change was found Confirmed by [facility]  MD, TREY ([id number]) on 10/30/2013  4:43:39 PM      MDM   Final diagnoses:  Acute cystitis without hematuria  Confusion    80 y.o. female with  pertinent PMH of HTN, aortic stenosis presents with increased confusion x 3 weeks in setting of fall.  The patient had mechanical fall after tripping over a dog hit her head and had a "goose egg". She was not evaluated by a physician. Since that time patient has had increased confusion which her daughter describes as intermittent loss of orientation and paranoia. She does have a history of anxiety in the past her daughter has had to encourage her not to complain.  Precipitant for today's visit was a T2 the patient's primary care physician was concerned about occult pathology so sent the patient for further evaluation. On arrival the patient's vital signs and physical exam are as above, no focal neuro deficits on exam and the patient is alert and oriented x3 at this time. We'll obtain basic lab work, CT head chest x-ray, and EKG to rule out acute pathology. After speaking with the patient's daughter for some time it appears that this has been ongoing for quite some time however is acutely worsening.  Labs and imaging obtained as above were unremarkable for acute pathology with the exception of urinary tract infection. CT scan of head negative. Although it is possible that her urinary tract infection is contributing to her symptoms, I suspect given gradual onset of symptoms in the nature of paranoia and her symptoms that this is likely the onset of dementia. I discussed this with her daughter and encouraged close outpatient followup for the same.  Patient was given antibiotic in the department and sent home with for same. They were given standard return precautions, voiced understanding and agreed to followup  1. Acute cystitis without hematuria   2. Confusion         Mirian Mo, MD 10/30/13 [phone]

## 2014-03-28 ENCOUNTER — Ambulatory Visit (INDEPENDENT_AMBULATORY_CARE_PROVIDER_SITE_OTHER): Payer: Medicare Other | Admitting: Diagnostic Neuroimaging

## 2014-03-28 ENCOUNTER — Encounter: Payer: Self-pay | Admitting: Diagnostic Neuroimaging

## 2014-03-28 VITALS — BP 144/76 | HR 80 | Ht 65.0 in

## 2014-03-28 DIAGNOSIS — F0391 Unspecified dementia with behavioral disturbance: Secondary | ICD-10-CM

## 2014-03-28 DIAGNOSIS — F03B18 Unspecified dementia, moderate, with other behavioral disturbance: Secondary | ICD-10-CM

## 2014-03-28 NOTE — Progress Notes (Signed)
[facility]  PATIENT: Marisa Sweeney DOB: 09/24/1916  REFERRING CLINICIAN: Juluis Rainier HISTORY FROM: patient and daughter  REASON FOR VISIT: new consult    HISTORICAL  CHIEF COMPLAINT:  Chief Complaint  Patient presents with  . New Evaluation    mental status changes     HISTORY OF PRESENT ILLNESS:   81 year old right-handed female with history of hypertension, high cholesterol, heart attack, stroke, bilateral hip replacements, here for evaluation of memory problems. Patient has had progressive cognitive decline over past few years. He does not living with daughter since 2008. In November 2015 patient was diagnosed with dementia and UTI, related to an episode of significant confusion. Patient has been using a walker since November 2015. She did using a cane for the past 10 years.  Patient is afraid of staying alone, easily cries, changes in disposition, but has good insight into her memory problems. Sometimes she forgets who her daughter is. Patient also has episodes of visual hallucinations. She's thought she saw a man in her bed one time. She has on and down fluctuations of cognitive ability and confusion.    REVIEW OF SYSTEMS: Full 14 system review of systems performed and notable only for fatigue murmur swelling in legs hearing loss loss of vision joint pain anxiety weakness confusion memory loss.  ALLERGIES: No Known Allergies  HOME MEDICATIONS: Outpatient Prescriptions Prior to Visit  Medication Sig Dispense Refill  . acetaminophen (TYLENOL) 500 MG tablet Take 500 mg by mouth every 6 (six) hours as needed (pain). Pain.    Marland Kitchen aspirin 81 MG tablet Take 81 mg by mouth at bedtime and may repeat dose one time if needed.     Marland Kitchen levothyroxine (SYNTHROID, LEVOTHROID) 75 MCG tablet Take 75 mcg by mouth daily before breakfast.    . losartan (COZAAR) 50 MG tablet Take 50 mg by mouth daily.    . Misc Natural Products (GLUCOS-CHONDROIT-MSM COMPLEX PO) Take  1 tablet by mouth 2 (two) times daily.      . Multiple Vitamins-Minerals (EYE VITAMINS PO) Take 1 tablet by mouth daily.     Marland Kitchen NIFEdipine (ADALAT CC) 90 MG 24 hr tablet Take 1 tablet (90 mg total) by mouth daily. 90 tablet 3  . polyethylene glycol (MIRALAX / GLYCOLAX) packet Take 17 g by mouth daily.    . simvastatin (ZOCOR) 40 MG tablet Take 1 tablet (40 mg total) by mouth every evening. 90 tablet 3  . traMADol (ULTRAM) 50 MG tablet Take 50-100 mg by mouth every 6 (six) hours as needed (pain). Maximum dose= 8 tablets per day  For pain    . losartan (COZAAR) 50 MG tablet Take 1 tablet (50 mg total) by mouth daily. 90 tablet 3  . cephALEXin (KEFLEX) 500 MG capsule Take 1 capsule (500 mg total) by mouth 4 (four) times daily. 28 capsule 0   No facility-administered medications prior to visit.    PAST MEDICAL HISTORY: Past Medical History  Diagnosis Date  . Hypertension   . Thyroid disease   . High cholesterol     h/o  . Heart murmur   . Myocardial infarction 1995  . Hypothyroidism   . GERD (gastroesophageal reflux disease)   . Constipation   . Hemorrhoid     h/o  . SAH (subarachnoid hemorrhage) 01/12/11    right frontal    PAST SURGICAL HISTORY: Past Surgical History  Procedure Laterality Date  . Joint replacement    . Coronary angioplasty    . Total  hip arthroplasty      right 1995; left 2000  . Tonsillectomy and adenoidectomy      "when I was little"    FAMILY HISTORY: History reviewed. No pertinent family history.  SOCIAL HISTORY:  History   Social History  . Marital Status: Widowed    Spouse Name: N/A  . Number of Children: N/A  . Years of Education: N/A   Occupational History  . Not on file.   Social History Main Topics  . Smoking status: Never Smoker   . Smokeless tobacco: Never Used  . Alcohol Use: No  . Drug Use: No  . Sexual Activity: No   Other Topics Concern  . Not on file   Social History Narrative   Daughter lives with her in the house    Uses a walker   Very HOH   Drinks one cup of coffee 3-4x a week      PHYSICAL EXAM  Filed Vitals:   03/28/14 1133  BP: 144/76  Pulse: 80  Height:  (1.651 m)    Body mass index is 0.00 kg/(m^2).  No exam data present  MMSE - Mini Mental State Exam 03/28/2014  Orientation to time 4  Orientation to Place 3  Registration 0  Attention/ Calculation 4  Recall 0  Language- name 2 objects 2  Language- repeat 1  Language- follow 3 step command 2  Language- read & follow direction 1  Write a sentence 0  Copy design 0  Total score 17    GENERAL EXAM: Patient is in no distress; well developed, nourished and groomed; neck is supple  CARDIOVASCULAR: Regular rate and rhythm, no murmurs, no carotid bruits  NEUROLOGIC: MENTAL STATUS: CALM, RELAXED, PLEASANT. Awake, alert, oriented to 2016 FEB 12, Wednesday; REGISTERS 0/3; RECALLS 0/3. Recent and remote memory DECREASED; DECR ATTENTION; language fluent, comprehension intact, naming intact, fund of knowledge appropriate; DECR HEARING; POSITIVE SNOUT AND MYERSON'S REFLEXES CRANIAL NERVE: no papilledema on fundoscopic exam, pupils equal and reactive to light, visual fields full to confrontation, extraocular muscles intact, no nystagmus, facial sensation and strength symmetric, DECR HEARING palate elevates symmetrically, uvula midline, shoulder shrug symmetric, tongue midline. MOTOR: normal bulk and tone, full strength in the BUE, BLE SENSORY: normal and symmetric to light touch, temperature, vibration COORDINATION: finger-nose-finger, fine finger movements normal REFLEXES: deep tendon reflexes present and symmetric; TRACE AT ANKLES GAIT/STATION: BARELY ABLE TO STAND; SIGNIFICANT GAIT APRAXIA; FEET STUCK TO GROUND    DIAGNOSTIC DATA (LABS, IMAGING, TESTING) - I reviewed patient records, labs, notes, testing and imaging myself where available.  Lab Results  Component Value Date   WBC 10.6* 10/30/2013   HGB 14.3 10/30/2013   HCT  44.2 10/30/2013   MCV 99.5 10/30/2013   PLT 337 10/30/2013      Component Value Date/Time   NA 143 10/30/2013 1611   K 4.2 10/30/2013 1611   CL 104 10/30/2013 1611   CO2 24 10/30/2013 1611   GLUCOSE 160* 10/30/2013 1611   BUN 21 10/30/2013 1611   CREATININE 0.74 10/30/2013 1611   CALCIUM 9.0 10/30/2013 1611   PROT 7.2 10/30/2013 1611   ALBUMIN 4.1 10/30/2013 1611   AST 12 10/30/2013 1611   ALT 8 10/30/2013 1611   ALKPHOS 61 10/30/2013 1611   BILITOT 0.4 10/30/2013 1611   GFRNONAA 71* 10/30/2013 1611   GFRAA 82* 10/30/2013 1611   No results found for: CHOL, HDL, LDLCALC, LDLDIRECT, TRIG, CHOLHDL No results found for: ZOXW9U No results found for: VITAMINB12  No results found for: TSH  I reviewed images myself and agree with interpretation. -VRP  10/30/13 CT head - mild diffuse atrophy; moderate-severe periventricular and subcortical chronic small vessel ischemic disease; no acute findings     ASSESSMENT AND PLAN  81 y.o. year old female here with progressive cognitive difficulties, gait difficulty, fluctuations in mental status, visual hallucinations and significant gait apraxia. Most likely represents moderate dementia (Alzheimer's disease versus dementia with Lewy bodies).   PLAN: - Discussed diagnosis, prognosis, treatment options. At this stage, given patient's advanced age and symptoms, I would favor a conservative, palliative approach. I do not think patient would have great benefit from cholinergic or memantine medications.  Return if symptoms worsen or fail to improve, for return to PCP.    Suanne MarkerVIKRAM R. Aylen Stradford, MD 03/28/2014, 12:27 PM Certified in Neurology, Neurophysiology and Neuroimaging  [facility] [address], [address] [place], [place] [address] [phone]

## 2015-03-28 ENCOUNTER — Emergency Department (HOSPITAL_COMMUNITY): Payer: Medicare Other

## 2015-03-28 ENCOUNTER — Emergency Department (HOSPITAL_COMMUNITY)
Admission: EM | Admit: 2015-03-28 | Discharge: 2015-03-28 | Disposition: A | Discharge status: Home / Self Care | Payer: Medicare Other | Attending: Emergency Medicine | Admitting: Emergency Medicine

## 2015-03-28 ENCOUNTER — Encounter (HOSPITAL_COMMUNITY): Payer: Self-pay | Admitting: Emergency Medicine

## 2015-03-28 DIAGNOSIS — E079 Disorder of thyroid, unspecified: Secondary | ICD-10-CM | POA: Insufficient documentation

## 2015-03-28 DIAGNOSIS — I252 Old myocardial infarction: Secondary | ICD-10-CM | POA: Diagnosis not present

## 2015-03-28 DIAGNOSIS — E7800 Pure hypercholesterolemia, unspecified: Secondary | ICD-10-CM | POA: Diagnosis not present

## 2015-03-28 DIAGNOSIS — Z79899 Other long term (current) drug therapy: Secondary | ICD-10-CM | POA: Insufficient documentation

## 2015-03-28 DIAGNOSIS — Z9861 Coronary angioplasty status: Secondary | ICD-10-CM | POA: Insufficient documentation

## 2015-03-28 DIAGNOSIS — E039 Hypothyroidism, unspecified: Secondary | ICD-10-CM | POA: Insufficient documentation

## 2015-03-28 DIAGNOSIS — R4182 Altered mental status, unspecified: Secondary | ICD-10-CM | POA: Diagnosis present

## 2015-03-28 DIAGNOSIS — F0391 Unspecified dementia with behavioral disturbance: Secondary | ICD-10-CM | POA: Insufficient documentation

## 2015-03-28 DIAGNOSIS — I10 Essential (primary) hypertension: Secondary | ICD-10-CM | POA: Insufficient documentation

## 2015-03-28 DIAGNOSIS — Z8719 Personal history of other diseases of the digestive system: Secondary | ICD-10-CM | POA: Diagnosis not present

## 2015-03-28 DIAGNOSIS — R011 Cardiac murmur, unspecified: Secondary | ICD-10-CM | POA: Diagnosis not present

## 2015-03-28 LAB — CBC WITH DIFFERENTIAL/PLATELET
BASOS ABS: 0 10*3/uL (ref 0.0–0.1)
Basophils Relative: 0 %
EOS ABS: 0.1 10*3/uL (ref 0.0–0.7)
EOS PCT: 1 %
HCT: 43.2 % (ref 36.0–46.0)
Hemoglobin: 13.5 g/dL (ref 12.0–15.0)
LYMPHS PCT: 15 %
Lymphs Abs: 1.2 10*3/uL (ref 0.7–4.0)
MCH: 31.8 pg (ref 26.0–34.0)
MCHC: 31.3 g/dL (ref 30.0–36.0)
MCV: 101.9 fL — AB (ref 78.0–100.0)
MONO ABS: 0.5 10*3/uL (ref 0.1–1.0)
Monocytes Relative: 7 %
Neutro Abs: 6.1 10*3/uL (ref 1.7–7.7)
Neutrophils Relative %: 77 %
PLATELETS: 295 10*3/uL (ref 150–400)
RBC: 4.24 MIL/uL (ref 3.87–5.11)
RDW: 15.5 % (ref 11.5–15.5)
WBC: 7.9 10*3/uL (ref 4.0–10.5)

## 2015-03-28 LAB — URINALYSIS, ROUTINE W REFLEX MICROSCOPIC
BILIRUBIN URINE: NEGATIVE
Glucose, UA: 100 mg/dL — AB
HGB URINE DIPSTICK: NEGATIVE
Ketones, ur: NEGATIVE mg/dL
Leukocytes, UA: NEGATIVE
Nitrite: NEGATIVE
PROTEIN: NEGATIVE mg/dL
Specific Gravity, Urine: 1.022 (ref 1.005–1.030)
pH: 6 (ref 5.0–8.0)

## 2015-03-28 LAB — COMPREHENSIVE METABOLIC PANEL
ALT: 65 U/L — ABNORMAL HIGH (ref 14–54)
AST: 73 U/L — AB (ref 15–41)
Albumin: 3.4 g/dL — ABNORMAL LOW (ref 3.5–5.0)
Alkaline Phosphatase: 85 U/L (ref 38–126)
Anion gap: 10 (ref 5–15)
BUN: 22 mg/dL — AB (ref 6–20)
CHLORIDE: 104 mmol/L (ref 101–111)
CO2: 28 mmol/L (ref 22–32)
CREATININE: 0.8 mg/dL (ref 0.44–1.00)
Calcium: 8.9 mg/dL (ref 8.9–10.3)
Glucose, Bld: 102 mg/dL — ABNORMAL HIGH (ref 65–99)
Potassium: 4.6 mmol/L (ref 3.5–5.1)
SODIUM: 142 mmol/L (ref 135–145)
TOTAL PROTEIN: 5.9 g/dL — AB (ref 6.5–8.1)
Total Bilirubin: 0.6 mg/dL (ref 0.3–1.2)

## 2015-03-28 LAB — CBG MONITORING, ED: GLUCOSE-CAPILLARY: 89 mg/dL (ref 65–99)

## 2015-03-28 MED ORDER — TRAMADOL HCL 50 MG PO TABS
50.0000 mg | ORAL_TABLET | Freq: Once | ORAL | Status: AC
  Administered 2015-03-28: 50 mg via ORAL
  Filled 2015-03-28: qty 1

## 2015-03-28 NOTE — ED Notes (Signed)
Patient comes from home. Per EMS family states patient is more Alerted than normal. Family state, " Patient talking more random than normal". Patient alert and oriented  to self. Vitals with EMS BP 128/68 98% RA CBG 127. Per  EMS family states patient only takes medication for her thyroid and tramadol.

## 2015-03-28 NOTE — ED Notes (Signed)
Patient uncooperative and yelling out loud.  Daughter keeps repeating that she cannot take care of her at home this way.  Stated if she goes home she will have to call 911 to bring her back

## 2015-03-28 NOTE — Progress Notes (Signed)
EDCM discussed patient with [facility] EDSW and EDP questioning need for TTS consult?

## 2015-03-28 NOTE — Discharge Instructions (Signed)

## 2015-03-28 NOTE — ED Notes (Signed)
Patient taken home by [facility].  Whiloe leaving the ED patient was yelling out loud

## 2015-03-28 NOTE — ED Notes (Signed)
[facility] CALLED @ [phone].

## 2015-03-28 NOTE — ED Provider Notes (Signed)
CSN: [id number]     Arrival date & time 03/28/15  1629 History   First MD Initiated Contact with Patient 03/28/15 1633     Chief Complaint  Patient presents with  . Dementia     (Consider location/radiation/quality/duration/timing/severity/associated sxs/prior Treatment) Patient is a 82 y.o. female presenting with altered mental status. The history is provided by a relative.  Altered Mental Status Presenting symptoms: behavior changes (speech increasingly confused), combativeness (ongoing), confusion (chronic) and disorientation   Severity:  Moderate Most recent episode:  Today Episode history:  Continuous Duration:  6 months Timing:  Constant Progression:  Worsening Chronicity:  Chronic (with worsening in last 2 weeks and specially last 2 days) Context: dementia   Associated symptoms: agitation   Associated symptoms: no abdominal pain, no bladder incontinence, no fever, no hallucinations, no slurred speech and no vomiting     Past Medical History  Diagnosis Date  . Hypertension   . Thyroid disease   . High cholesterol     h/o  . Heart murmur   . Myocardial infarction (HCC) 1995  . Hypothyroidism   . GERD (gastroesophageal reflux disease)   . Constipation   . Hemorrhoid     h/o  . SAH (subarachnoid hemorrhage) (HCC) 01/12/11    right frontal   Past Surgical History  Procedure Laterality Date  . Joint replacement    . Coronary angioplasty    . Total hip arthroplasty      right 1995; left 2000  . Tonsillectomy and adenoidectomy      "when I was little"   No family history on file. Social History  Substance Use Topics  . Smoking status: Never Smoker   . Smokeless tobacco: Never Used  . Alcohol Use: No   OB History    No data available     Review of Systems  Constitutional: Negative for fever.  Gastrointestinal: Negative for vomiting and abdominal pain.  Genitourinary: Negative for bladder incontinence.  Psychiatric/Behavioral: Positive for confusion  (chronic) and agitation. Negative for hallucinations.  All other systems reviewed and are negative.     Allergies  Influenza vaccines  Home Medications   Prior to Admission medications   Medication Sig Start Date End Date Taking? Authorizing Provider  levothyroxine (SYNTHROID, LEVOTHROID) 50 MCG tablet Take 50 mcg by mouth daily before breakfast.   Yes Historical Provider, MD  levothyroxine (SYNTHROID, LEVOTHROID) 75 MCG tablet Take 75 mcg by mouth daily before breakfast. Takes on Tues, Thurs, and Sat   Yes Historical Provider, MD  losartan (COZAAR) 50 MG tablet Take 1 tablet (50 mg total) by mouth daily. 03/11/12 03/27/13  Pricilla Riffle, MD  losartan (COZAAR) 50 MG tablet Take 50 mg by mouth daily.   Yes Historical Provider, MD  NIFEdipine (ADALAT CC) 90 MG 24 hr tablet Take 1 tablet (90 mg total) by mouth daily. 03/11/12  Yes Pricilla Riffle, MD  simvastatin (ZOCOR) 40 MG tablet Take 1 tablet (40 mg total) by mouth every evening. Patient not taking: Reported on 03/28/2015 03/11/12   Pricilla Riffle, MD  traMADol (ULTRAM) 50 MG tablet Take 50-100 mg by mouth every 6 (six) hours as needed (pain). Maximum dose= 8 tablets per day  For pain   Yes Historical Provider, MD   BP 168/90 mmHg  Pulse 73  Temp(Src) 99.6 F (37.6 C) (Oral)  Resp 18  SpO2 98% Physical Exam  Constitutional: She appears well-developed and well-nourished. No distress.  HENT:  Head: Normocephalic.  Eyes: Conjunctivae are normal.  Neck: Neck supple. No tracheal deviation present.  Cardiovascular: Normal rate, regular rhythm and normal heart sounds.   Pulmonary/Chest: Effort normal and breath sounds normal. No respiratory distress. She has no wheezes. She has no rales.  Abdominal: Soft. She exhibits no distension. There is no tenderness. There is no rebound and no guarding.  Neurological: She is alert. She has normal strength. She is disoriented. No cranial nerve deficit or sensory deficit. GCS eye subscore is 4. GCS verbal  subscore is 4. GCS motor subscore is 6.  Skin: Skin is warm and dry.  Psychiatric: She has a normal mood and affect.  Vitals reviewed.   ED Course  Procedures (including critical care time) Labs Review Labs Reviewed  CBC WITH DIFFERENTIAL/PLATELET - Abnormal; Notable for the following:    MCV 101.9 (*)    All other components within normal limits  COMPREHENSIVE METABOLIC PANEL - Abnormal; Notable for the following:    Glucose, Bld 102 (*)    BUN 22 (*)    Total Protein 5.9 (*)    Albumin 3.4 (*)    AST 73 (*)    ALT 65 (*)    All other components within normal limits  URINALYSIS, ROUTINE W REFLEX MICROSCOPIC (NOT AT [facility]) - Abnormal; Notable for the following:    Color, Urine AMBER (*)    APPearance HAZY (*)    Glucose, UA 100 (*)    All other components within normal limits  CBG MONITORING, ED    Imaging Review Ct Head Wo Contrast  03/28/2015  CLINICAL DATA:  82 year old female with altered mental status from baseline. History of dementia. EXAM: CT HEAD WITHOUT CONTRAST TECHNIQUE: Contiguous axial images were obtained from the base of the skull through the vertex without intravenous contrast. COMPARISON:  Head CT 10/30/2013 FINDINGS: Despite utilization of straps and multiple staff members holding patient, there is moderate motion artifact. Allowing for this, no evidence of intracranial hemorrhage, extra-axial fluid collection, or hydrocephalus. There is questionable hypodensity in the anterior right temporal lobe. Hyperdensity in the left basal ganglia corresponds to calcifications on prior CT. Ventricular system grossly unchanged. Calvarium grossly intact, suboptimally assessed. Fluid within the sphenoid sinus is again seen, chronic. IMPRESSION: Motion limited exam. Questionable hypodensity within the anterior right temporal lobe versus artifact. Temporal lobe ischemia cannot be excluded on current exam. Electronically Signed   By: Rubye Oaks M.D.   On: 03/28/2015 21:14   Dg  Chest Port 1 View  03/28/2015  CLINICAL DATA:  Altered mental status for 1 day EXAM: PORTABLE CHEST 1 VIEW COMPARISON:  October 30, 2013 FINDINGS: The heart size and mediastinal contours are stable. The heart size is enlarged. The aorta is tortuous. Both lungs are clear. The visualized skeletal structures are stable. IMPRESSION: No active cardiopulmonary disease.  Mild cardiomegaly Electronically Signed   By: Sherian Rein M.D.   On: 03/28/2015 17:02   I have personally reviewed and evaluated these images and lab results as part of my medical decision-making.   EKG Interpretation None       MDM   Final diagnoses:  Altered mental status  Dementia, with behavioral disturbance    82 y.o. female presents with Progressive dementia symptoms, walking into the backyard, being combative with her family, and increased confusion from her baseline for several months but acutely worse over the last 2 days. She lives at home with her family but has no ancillary resources available. Has a primary care doctor through [facility] which has been working with them  on ongoing dementia. Workup performed to rule out neurologic, hematologic, infectious, metabolic causes of confusion in this frail elderly lady. Care management consult to help with establishing outpatient resources for help in the home.  Workup is negative with limited CT head due to motion artifact, but no bleeding is evident. Given motion degradation and lack of typical symptoms or any neurologic deficits, abnormalities on CT are more likely secondary to artifact. I discussed these findings with the patient's family who are concerned because of her difficulties at home and ongoing disorientation but at this time she would not qualify for inpatient stay and I explained to them that we cannot help with placement to the emergency department. Case management discussed options with them and is arranging for home health services including nursing, home health  aides, physical therapy, social work and other resources.  Lyndal Pulley, MD 03/28/15 [phone]

## 2015-03-28 NOTE — Care Management Note (Addendum)
Case Management Note  Patient Details  Name: Marisa Sweeney MRN: [id number] Date of Birth: May 29, 1916  Subjective/Objective:   Patient presents to ED with increased confusion.  Patient with history of dementia.                 Action/Plan: EDCM spoke to patient's daughter at bedside, Olegario Messier [phone].  Patient lives at home with her daughter and daughter's fiance.  Patient has had home health services in the past, patient's daughter thinks it was with [facility].  Patient's daughter Consuelo Pandy [facility] for services.  Patient has a walker, wheelchair, ramp, transport chair and bedside commode at home.  Patient's daughter denies any need for dme.  EDCM explained home health services, private duty services and [facility] services to patient's daughter.  [facility] faxed lists of home health agencies in [facility], list of private duty nursing agencies (explained of of pocket expense) contact information to [facility] and phone number to [facility] to patient's daughter at [facility] ED with confirmation of receipt.  RN Britta Mccreedy confirms receipt of materials and will provide to patient's daughter.  Patient's daughter confirms patient's pcp is Dr. Zachery Dauer. Discussed with EDP who reports the family would not want patient placed into a facility at this time.  Home health SW ordered for placement needs from community level.  Patient's daughter thankful for services.  No further EDCm needs at this time.  [facility] faxed referral to [facility] with confirmation of receipt.    Expected Discharge Date:                  Expected Discharge Plan:  Home w Home Health Services  In-House Referral:     Discharge planning Services  CM Consult  Post Acute Care Choice:  Home Health Choice offered to:  Adult Children (patient with dementia)  DME Arranged:   (none needed per patient's daughter) DME Agency:     HH Arranged:  RN, PT, OT, Nurse's Aide, Social Work [employer] Agency:  [facility]  Status of Service:  Completed, signed off  Medicare Important  Message Given:    Date Medicare IM Given:    Medicare IM give by:    Date Additional Medicare IM Given:    Additional Medicare Important Message give by:     If discussed at Long Length of Stay Meetings, dates discussed:    Additional CommentsRadford Pax, RN 03/28/2015, 8:21 PM

## 2015-04-03 ENCOUNTER — Telehealth: Payer: Self-pay | Admitting: General Practice

## 2015-04-08 ENCOUNTER — Emergency Department (HOSPITAL_COMMUNITY)
Admission: EM | Admit: 2015-04-08 | Discharge: 2015-04-09 | Disposition: A | Discharge status: Home / Self Care | Payer: Medicare Other | Attending: Emergency Medicine | Admitting: Emergency Medicine

## 2015-04-08 ENCOUNTER — Emergency Department (HOSPITAL_COMMUNITY): Payer: Medicare Other

## 2015-04-08 ENCOUNTER — Encounter (HOSPITAL_COMMUNITY): Payer: Self-pay | Admitting: Emergency Medicine

## 2015-04-08 DIAGNOSIS — I10 Essential (primary) hypertension: Secondary | ICD-10-CM | POA: Diagnosis not present

## 2015-04-08 DIAGNOSIS — Y998 Other external cause status: Secondary | ICD-10-CM | POA: Insufficient documentation

## 2015-04-08 DIAGNOSIS — Y9289 Other specified places as the place of occurrence of the external cause: Secondary | ICD-10-CM | POA: Diagnosis not present

## 2015-04-08 DIAGNOSIS — Z9861 Coronary angioplasty status: Secondary | ICD-10-CM | POA: Insufficient documentation

## 2015-04-08 DIAGNOSIS — S0990XA Unspecified injury of head, initial encounter: Secondary | ICD-10-CM | POA: Insufficient documentation

## 2015-04-08 DIAGNOSIS — S4992XA Unspecified injury of left shoulder and upper arm, initial encounter: Secondary | ICD-10-CM | POA: Diagnosis not present

## 2015-04-08 DIAGNOSIS — F419 Anxiety disorder, unspecified: Secondary | ICD-10-CM | POA: Diagnosis not present

## 2015-04-08 DIAGNOSIS — E039 Hypothyroidism, unspecified: Secondary | ICD-10-CM | POA: Diagnosis not present

## 2015-04-08 DIAGNOSIS — F0390 Unspecified dementia without behavioral disturbance: Secondary | ICD-10-CM | POA: Diagnosis not present

## 2015-04-08 DIAGNOSIS — Z79899 Other long term (current) drug therapy: Secondary | ICD-10-CM | POA: Diagnosis not present

## 2015-04-08 DIAGNOSIS — Y9389 Activity, other specified: Secondary | ICD-10-CM | POA: Diagnosis not present

## 2015-04-08 DIAGNOSIS — R011 Cardiac murmur, unspecified: Secondary | ICD-10-CM | POA: Diagnosis not present

## 2015-04-08 DIAGNOSIS — I252 Old myocardial infarction: Secondary | ICD-10-CM | POA: Insufficient documentation

## 2015-04-08 DIAGNOSIS — W19XXXA Unspecified fall, initial encounter: Secondary | ICD-10-CM

## 2015-04-08 DIAGNOSIS — S3992XA Unspecified injury of lower back, initial encounter: Secondary | ICD-10-CM | POA: Insufficient documentation

## 2015-04-08 DIAGNOSIS — W06XXXA Fall from bed, initial encounter: Secondary | ICD-10-CM | POA: Diagnosis not present

## 2015-04-08 DIAGNOSIS — Z8719 Personal history of other diseases of the digestive system: Secondary | ICD-10-CM | POA: Insufficient documentation

## 2015-04-08 NOTE — ED Notes (Signed)
Pt presents to ER with [facility] for FALLS x 3 today per family report; family reports patient has been c/o lower back pain and LEFT arm pain after last fall; family states falls occur when transferring from bed to potty chair and vice versa; pt has hx of dementia and appears very anxious and tearful at this time

## 2015-04-08 NOTE — ED Provider Notes (Signed)
CSN: [id number]     Arrival date & time 04/08/15  2258 History  By signing my name below, I, Ronney Lion, attest that this documentation has been prepared under the direction and in the presence of Tomasita Crumble, MD. Electronically Signed: Ronney Lion, ED Scribe. 04/08/2015. 12:50 PM.   Chief Complaint  Patient presents with  . Fall  . Back Pain  . Anxiety   The history is provided by a relative and medical records. History limited by: dementia. No language interpreter was used.    LEVEL 5 CAVEAT DUE TO DEMENTIA  HPI Comments: Marisa Sweeney is a 82 y.o. female with a history of dementia, hypertension, thyroid disease, high cholesterol, heart murmur, MI, hypothyroidism, GERD, and SAH, brought in by ambulance, who presents to the Emergency Department S/P falling 3 times today. Per daughter, the first time patient had fallen, she struck her head on the floor. She denies LOC. The second time, she fell onto the bed. Her daughter states that after falling, patient complained of tailbone pain and left arm pain. Per nursing notes, patient's family states she has fallen whenever she transfers from the bed to the potty chair and vice versa. Her daughter notes a history of hip replacement. Her daughter notes she has recently had worsening dementia, but she denies any recent illnesses otherwise. She denies any anticoagulant use.  Past Medical History  Diagnosis Date  . Hypertension   . Thyroid disease   . High cholesterol     h/o  . Heart murmur   . Myocardial infarction (HCC) 1995  . Hypothyroidism   . GERD (gastroesophageal reflux disease)   . Constipation   . Hemorrhoid     h/o  . SAH (subarachnoid hemorrhage) (HCC) 01/12/11    right frontal   Past Surgical History  Procedure Laterality Date  . Joint replacement    . Coronary angioplasty    . Total hip arthroplasty      right 1995; left 2000  . Tonsillectomy and adenoidectomy      "when I was little"   No family history on  file. Social History  Substance Use Topics  . Smoking status: Never Smoker   . Smokeless tobacco: Never Used  . Alcohol Use: No   OB History    No data available     Review of Systems A complete 10 system review of systems was obtained and all systems are negative except as noted in the HPI and PMH.    Allergies  Influenza vaccines  Home Medications   Prior to Admission medications   Medication Sig Start Date End Date Taking? Authorizing Provider  levothyroxine (SYNTHROID, LEVOTHROID) 50 MCG tablet Take 50 mcg by mouth daily before breakfast.    Historical Provider, MD  levothyroxine (SYNTHROID, LEVOTHROID) 75 MCG tablet Take 75 mcg by mouth daily before breakfast. Takes on Tues, Thurs, and Sat    Historical Provider, MD  losartan (COZAAR) 50 MG tablet Take 1 tablet (50 mg total) by mouth daily. 03/11/12 03/27/13  Pricilla Riffle, MD  losartan (COZAAR) 50 MG tablet Take 50 mg by mouth daily.    Historical Provider, MD  NIFEdipine (ADALAT CC) 90 MG 24 hr tablet Take 1 tablet (90 mg total) by mouth daily. 03/11/12   Pricilla Riffle, MD  simvastatin (ZOCOR) 40 MG tablet Take 1 tablet (40 mg total) by mouth every evening. Patient not taking: Reported on 03/28/2015 03/11/12   Pricilla Riffle, MD  traMADol Janean Sark) 50 MG tablet Take  50-100 mg by mouth every 6 (six) hours as needed (pain). Maximum dose= 8 tablets per day  For pain    Historical Provider, MD   BP 120/56 mmHg  Pulse 88  Temp(Src) 98.6 F (37 C) (Oral)  Resp 20  SpO2 91% Physical Exam  Constitutional: She appears well-developed and well-nourished. No distress.  HENT:  Head: Normocephalic and atraumatic.  Nose: Nose normal.  Mouth/Throat: Oropharynx is clear and moist. No oropharyngeal exudate.  Eyes: Conjunctivae and EOM are normal. Pupils are equal, round, and reactive to light. No scleral icterus.  Neck: Normal range of motion. Neck supple. No JVD present. No tracheal deviation present. No thyromegaly present.  Cardiovascular:  Normal rate, regular rhythm and normal heart sounds.  Exam reveals no gallop and no friction rub.   No murmur heard. Pulmonary/Chest: Effort normal and breath sounds normal. No respiratory distress. She has no wheezes. She exhibits no tenderness.  Abdominal: Soft. Bowel sounds are normal. She exhibits no distension and no mass. There is no tenderness. There is no rebound and no guarding.  Musculoskeletal: Normal range of motion. She exhibits no edema or tenderness.  Lymphadenopathy:    She has no cervical adenopathy.  Neurological: She is alert. No cranial nerve deficit. She exhibits normal muscle tone.  Follows commands and moves all 4 extremities.  Skin: Skin is warm and dry. No rash noted. No erythema. No pallor.  Nursing note and vitals reviewed.   ED Course  Procedures (including critical care time)  DIAGNOSTIC STUDIES: Oxygen Saturation is 94% on RA, adequate by my interpretation.    COORDINATION OF CARE: 11:19 PM - Discussed treatment plan with pt at bedside which includes x-rays and CT scans. Pt verbalized understanding and agreed to plan.   Imaging Review Dg Lumbar Spine 2-3 Views  04/09/2015  CLINICAL DATA:  Multiple falls today.  Low back pain. EXAM: LUMBAR SPINE - 2-3 VIEW COMPARISON:  None. FINDINGS: Lumbar scoliosis convex towards the left. This is likely due to degenerative change. There are diffuse degenerative changes throughout the lumbar spine with narrowed interspaces and endplate hypertrophic changes. Degenerative disc disease at multiple levels. Lateral compression of L3 to the right is likely associated with scoliosis. No anterior subluxation of the vertebrae. Vascular calcifications. Calcification in the pelvis consistent with a fibroid. Left hip arthroplasty. IMPRESSION: Diffuse degenerative change and scoliosis throughout the lumbar spine. No acute process identified. Electronically Signed   By: Burman Nieves M.D.   On: 04/09/2015 00:21   Dg  Sacrum/coccyx  04/09/2015  CLINICAL DATA:  Multiple falls today.  Low back pain. EXAM: SACRUM AND COCCYX - 2+ VIEW COMPARISON:  None. FINDINGS: Diffuse bone demineralization and stool limit evaluation of the sacral coccygeal spine. Visualized sacral struts appear intact. SI joints are not displaced. No focal bone lesion or bone destruction. Normal alignment of the sacral spine. No displaced fractures identified. Incidental note of bilateral hip arthroplasties. Calcification in the pelvis consistent with fibroid. Vascular calcifications. Degenerative changes in the lower lumbar spine. IMPRESSION: No acute bony abnormalities demonstrated. Electronically Signed   By: Burman Nieves M.D.   On: 04/09/2015 00:22   Ct Head Wo Contrast  04/09/2015  CLINICAL DATA:  82 year old post 3 falls today.  Head injury. EXAM: CT HEAD WITHOUT CONTRAST TECHNIQUE: Contiguous axial images were obtained from the base of the skull through the vertex without intravenous contrast. COMPARISON:  Head CT 03/28/2015 FINDINGS: No intracranial hemorrhage, mass effect, or midline shift. Generalized atrophy and chronic small vessel ischemia. No  hydrocephalus. The basilar cisterns are patent. No evidence of territorial infarct. No intracranial fluid collection. Atherosclerosis of the intracranial vasculature at the skull base. Calvarium is intact. Unchanged fluid level in the sphenoid sinus, remaining paranasal sinuses are clear. Mastoid air cells are well aerated. IMPRESSION: Atrophy and chronic small vessel ischemia. No acute intracranial abnormality. Electronically Signed   By: Rubye OaksMelanie  Ehinger M.D.   On: 04/09/2015 00:13   I have personally reviewed and evaluated these images and lab results as part of my medical decision-making.  MDM   Final diagnoses:  None    Patient presents to the ED after multiple falls.  Per daughter, she complains of pain on her tailbone, low back and posterior head.  Will obtain imaging for evaluation.  She  has otherwise been in her normal state of health, no need for lab studies.  Imaging studies are negative.  Patient continues to be at her normal baseline per daughter.  She appears well and in NAD.  Her VS remain within her normal limits and she is safe for DC.    I personally performed the services described in this documentation, which was scribed in my presence. The recorded information has been reviewed and is accurate.      Tomasita CrumbleAdeleke Clarisse Rodriges, MD 04/09/15 [id number]

## 2015-04-08 NOTE — ED Notes (Signed)
Oni at bedside 

## 2015-04-08 NOTE — Discharge Instructions (Signed)
Fall Prevention in the Home  Ms. Fata, your CT scan and xrays today did not show any injuries.  See a primary care physician within 3 days for close follow up. If symptoms worsen, come back to the ED immediately. Thank you.  Falls can cause injuries. They can happen to people of all ages. There are many things you can do to make your home safe and to help prevent falls.  WHAT CAN I DO ON THE OUTSIDE OF MY HOME?  Regularly fix the edges of walkways and driveways and fix any cracks.  Remove anything that might make you trip as you walk through a door, such as a raised step or threshold.  Trim any bushes or trees on the path to your home.  Use bright outdoor lighting.  Clear any walking paths of anything that might make someone trip, such as rocks or tools.  Regularly check to see if handrails are loose or broken. Make sure that both sides of any steps have handrails.  Any raised decks and porches should have guardrails on the edges.  Have any leaves, snow, or ice cleared regularly.  Use sand or salt on walking paths during winter.  Clean up any spills in your garage right away. This includes oil or grease spills. WHAT CAN I DO IN THE BATHROOM?   Use night lights.  Install grab bars by the toilet and in the tub and shower. Do not use towel bars as grab bars.  Use non-skid mats or decals in the tub or shower.  If you need to sit down in the shower, use a plastic, non-slip stool.  Keep the floor dry. Clean up any water that spills on the floor as soon as it happens.  Remove soap buildup in the tub or shower regularly.  Attach bath mats securely with double-sided non-slip rug tape.  Do not have throw rugs and other things on the floor that can make you trip. WHAT CAN I DO IN THE BEDROOM?  Use night lights.  Make sure that you have a light by your bed that is easy to reach.  Do not use any sheets or blankets that are too big for your bed. They should not hang down onto the  floor.  Have a firm chair that has side arms. You can use this for support while you get dressed.  Do not have throw rugs and other things on the floor that can make you trip. WHAT CAN I DO IN THE KITCHEN?  Clean up any spills right away.  Avoid walking on wet floors.  Keep items that you use a lot in easy-to-reach places.  If you need to reach something above you, use a strong step stool that has a grab bar.  Keep electrical cords out of the way.  Do not use floor polish or wax that makes floors slippery. If you must use wax, use non-skid floor wax.  Do not have throw rugs and other things on the floor that can make you trip. WHAT CAN I DO WITH MY STAIRS?  Do not leave any items on the stairs.  Make sure that there are handrails on both sides of the stairs and use them. Fix handrails that are broken or loose. Make sure that handrails are as long as the stairways.  Check any carpeting to make sure that it is firmly attached to the stairs. Fix any carpet that is loose or worn.  Avoid having throw rugs at the top or bottom  of the stairs. If you do have throw rugs, attach them to the floor with carpet tape.  Make sure that you have a light switch at the top of the stairs and the bottom of the stairs. If you do not have them, ask someone to add them for you. WHAT ELSE CAN I DO TO HELP PREVENT FALLS?  Wear shoes that:  Do not have high heels.  Have rubber bottoms.  Are comfortable and fit you well.  Are closed at the toe. Do not wear sandals.  If you use a stepladder:  Make sure that it is fully opened. Do not climb a closed stepladder.  Make sure that both sides of the stepladder are locked into place.  Ask someone to hold it for you, if possible.  Clearly mark and make sure that you can see:  Any grab bars or handrails.  First and last steps.  Where the edge of each step is.  Use tools that help you move around (mobility aids) if they are needed. These  include:  Canes.  Walkers.  Scooters.  Crutches.  Turn on the lights when you go into a dark area. Replace any light bulbs as soon as they burn out.  Set up your furniture so you have a clear path. Avoid moving your furniture around.  If any of your floors are uneven, fix them.  If there are any pets around you, be aware of where they are.  Review your medicines with your doctor. Some medicines can make you feel dizzy. This can increase your chance of falling. Ask your doctor what other things that you can do to help prevent falls.   This information is not intended to replace advice given to you by your health care provider. Make sure you discuss any questions you have with your health care provider.   Document Released: 11/15/2008 Document Revised: 06/05/2014 Document Reviewed: 02/23/2014 Elsevier Interactive Patient Education [employer]2016 [employer] Inc.

## 2015-04-09 ENCOUNTER — Emergency Department (HOSPITAL_COMMUNITY): Payer: Medicare Other

## 2015-04-09 DIAGNOSIS — S3992XA Unspecified injury of lower back, initial encounter: Secondary | ICD-10-CM | POA: Diagnosis not present

## 2015-06-23 ENCOUNTER — Encounter (HOSPITAL_COMMUNITY): Payer: Self-pay | Admitting: Emergency Medicine

## 2015-06-23 ENCOUNTER — Inpatient Hospital Stay (HOSPITAL_COMMUNITY)
Admission: EM | Admit: 2015-06-23 | Discharge: 2015-07-04 | DRG: 871 | Disposition: E | Payer: Medicare Other | Attending: Internal Medicine | Admitting: Internal Medicine

## 2015-06-23 ENCOUNTER — Emergency Department (HOSPITAL_COMMUNITY): Payer: Medicare Other

## 2015-06-23 DIAGNOSIS — I2510 Atherosclerotic heart disease of native coronary artery without angina pectoris: Secondary | ICD-10-CM | POA: Diagnosis present

## 2015-06-23 DIAGNOSIS — Z887 Allergy status to serum and vaccine status: Secondary | ICD-10-CM | POA: Diagnosis not present

## 2015-06-23 DIAGNOSIS — Z9861 Coronary angioplasty status: Secondary | ICD-10-CM | POA: Diagnosis not present

## 2015-06-23 DIAGNOSIS — R778 Other specified abnormalities of plasma proteins: Secondary | ICD-10-CM | POA: Diagnosis present

## 2015-06-23 DIAGNOSIS — E7800 Pure hypercholesterolemia, unspecified: Secondary | ICD-10-CM | POA: Diagnosis present

## 2015-06-23 DIAGNOSIS — L8990 Pressure ulcer of unspecified site, unspecified stage: Secondary | ICD-10-CM | POA: Insufficient documentation

## 2015-06-23 DIAGNOSIS — E039 Hypothyroidism, unspecified: Secondary | ICD-10-CM | POA: Diagnosis present

## 2015-06-23 DIAGNOSIS — I252 Old myocardial infarction: Secondary | ICD-10-CM | POA: Diagnosis not present

## 2015-06-23 DIAGNOSIS — Z8679 Personal history of other diseases of the circulatory system: Secondary | ICD-10-CM

## 2015-06-23 DIAGNOSIS — I214 Non-ST elevation (NSTEMI) myocardial infarction: Secondary | ICD-10-CM | POA: Diagnosis present

## 2015-06-23 DIAGNOSIS — Z789 Other specified health status: Secondary | ICD-10-CM | POA: Diagnosis not present

## 2015-06-23 DIAGNOSIS — I248 Other forms of acute ischemic heart disease: Secondary | ICD-10-CM | POA: Diagnosis present

## 2015-06-23 DIAGNOSIS — N179 Acute kidney failure, unspecified: Secondary | ICD-10-CM | POA: Diagnosis present

## 2015-06-23 DIAGNOSIS — A419 Sepsis, unspecified organism: Secondary | ICD-10-CM | POA: Diagnosis present

## 2015-06-23 DIAGNOSIS — Z96641 Presence of right artificial hip joint: Secondary | ICD-10-CM | POA: Diagnosis present

## 2015-06-23 DIAGNOSIS — N390 Urinary tract infection, site not specified: Secondary | ICD-10-CM | POA: Diagnosis present

## 2015-06-23 DIAGNOSIS — N170 Acute kidney failure with tubular necrosis: Secondary | ICD-10-CM | POA: Diagnosis present

## 2015-06-23 DIAGNOSIS — L89151 Pressure ulcer of sacral region, stage 1: Secondary | ICD-10-CM | POA: Diagnosis present

## 2015-06-23 DIAGNOSIS — Z79899 Other long term (current) drug therapy: Secondary | ICD-10-CM

## 2015-06-23 DIAGNOSIS — E785 Hyperlipidemia, unspecified: Secondary | ICD-10-CM | POA: Diagnosis present

## 2015-06-23 DIAGNOSIS — I10 Essential (primary) hypertension: Secondary | ICD-10-CM

## 2015-06-23 DIAGNOSIS — Z515 Encounter for palliative care: Secondary | ICD-10-CM | POA: Diagnosis present

## 2015-06-23 DIAGNOSIS — K219 Gastro-esophageal reflux disease without esophagitis: Secondary | ICD-10-CM | POA: Diagnosis present

## 2015-06-23 DIAGNOSIS — F0390 Unspecified dementia without behavioral disturbance: Secondary | ICD-10-CM | POA: Diagnosis not present

## 2015-06-23 DIAGNOSIS — R4182 Altered mental status, unspecified: Secondary | ICD-10-CM | POA: Diagnosis not present

## 2015-06-23 DIAGNOSIS — F0391 Unspecified dementia with behavioral disturbance: Secondary | ICD-10-CM | POA: Diagnosis present

## 2015-06-23 DIAGNOSIS — H9193 Unspecified hearing loss, bilateral: Secondary | ICD-10-CM | POA: Diagnosis present

## 2015-06-23 DIAGNOSIS — N3000 Acute cystitis without hematuria: Secondary | ICD-10-CM

## 2015-06-23 DIAGNOSIS — I350 Nonrheumatic aortic (valve) stenosis: Secondary | ICD-10-CM

## 2015-06-23 DIAGNOSIS — R011 Cardiac murmur, unspecified: Secondary | ICD-10-CM | POA: Diagnosis present

## 2015-06-23 DIAGNOSIS — I447 Left bundle-branch block, unspecified: Secondary | ICD-10-CM

## 2015-06-23 DIAGNOSIS — G9340 Encephalopathy, unspecified: Secondary | ICD-10-CM | POA: Diagnosis present

## 2015-06-23 DIAGNOSIS — R079 Chest pain, unspecified: Secondary | ICD-10-CM | POA: Diagnosis not present

## 2015-06-23 DIAGNOSIS — Z66 Do not resuscitate: Secondary | ICD-10-CM | POA: Diagnosis present

## 2015-06-23 DIAGNOSIS — R7989 Other specified abnormal findings of blood chemistry: Secondary | ICD-10-CM

## 2015-06-23 DIAGNOSIS — E860 Dehydration: Secondary | ICD-10-CM | POA: Diagnosis present

## 2015-06-23 HISTORY — DX: Unspecified dementia, unspecified severity, without behavioral disturbance, psychotic disturbance, mood disturbance, and anxiety: F03.90

## 2015-06-23 LAB — BLOOD GAS, VENOUS
ACID-BASE EXCESS: 0.8 mmol/L (ref 0.0–2.0)
Bicarbonate: 24.5 mEq/L — ABNORMAL HIGH (ref 20.0–24.0)
O2 SAT: 60.1 %
PCO2 VEN: 38 mmHg — AB (ref 45.0–50.0)
PH VEN: 7.425 — AB (ref 7.250–7.300)
Patient temperature: 98.6
TCO2: 22.1 mmol/L (ref 0–100)
pO2, Ven: 31.2 mmHg (ref 31.0–45.0)

## 2015-06-23 LAB — COMPREHENSIVE METABOLIC PANEL
ALK PHOS: 132 U/L — AB (ref 38–126)
ALT: 17 U/L (ref 14–54)
AST: 28 U/L (ref 15–41)
Albumin: 2.8 g/dL — ABNORMAL LOW (ref 3.5–5.0)
Anion gap: 11 (ref 5–15)
BUN: 37 mg/dL — AB (ref 6–20)
CALCIUM: 8.6 mg/dL — AB (ref 8.9–10.3)
CHLORIDE: 102 mmol/L (ref 101–111)
CO2: 24 mmol/L (ref 22–32)
CREATININE: 1.54 mg/dL — AB (ref 0.44–1.00)
GFR, EST AFRICAN AMERICAN: 32 mL/min — AB (ref >60)
GFR, EST NON AFRICAN AMERICAN: 27 mL/min — AB (ref >60)
Glucose, Bld: 164 mg/dL — ABNORMAL HIGH (ref 65–99)
Potassium: 4.1 mmol/L (ref 3.5–5.1)
SODIUM: 137 mmol/L (ref 135–145)
Total Bilirubin: 1 mg/dL (ref 0.3–1.2)
Total Protein: 5.7 g/dL — ABNORMAL LOW (ref 6.5–8.1)

## 2015-06-23 LAB — URINE MICROSCOPIC-ADD ON

## 2015-06-23 LAB — CBC WITH DIFFERENTIAL/PLATELET
BASOS PCT: 0 %
Basophils Absolute: 0 10*3/uL (ref 0.0–0.1)
EOS ABS: 0 10*3/uL (ref 0.0–0.7)
EOS PCT: 0 %
HCT: 41.8 % (ref 36.0–46.0)
Hemoglobin: 13.7 g/dL (ref 12.0–15.0)
LYMPHS ABS: 0.3 10*3/uL — AB (ref 0.7–4.0)
Lymphocytes Relative: 2 %
MCH: 32.2 pg (ref 26.0–34.0)
MCHC: 32.8 g/dL (ref 30.0–36.0)
MCV: 98.1 fL (ref 78.0–100.0)
MONOS PCT: 4 %
Monocytes Absolute: 0.7 10*3/uL (ref 0.1–1.0)
Neutro Abs: 16.1 10*3/uL — ABNORMAL HIGH (ref 1.7–7.7)
Neutrophils Relative %: 94 %
PLATELETS: 143 10*3/uL — AB (ref 150–400)
RBC: 4.26 MIL/uL (ref 3.87–5.11)
RDW: 14.9 % (ref 11.5–15.5)
WBC: 17.1 10*3/uL — ABNORMAL HIGH (ref 4.0–10.5)

## 2015-06-23 LAB — I-STAT CG4 LACTIC ACID, ED
Lactic Acid, Venous: 5.07 mmol/L (ref 0.5–2.0)
Lactic Acid, Venous: 3.29 mmol/L (ref 0.5–2.0)

## 2015-06-23 LAB — PROCALCITONIN: Procalcitonin: 53.26 ng/mL

## 2015-06-23 LAB — TROPONIN I
TROPONIN I: 0.25 ng/mL — AB (ref <0.031)
TROPONIN I: 0.16 ng/mL — AB (ref <0.031)

## 2015-06-23 LAB — URINALYSIS, ROUTINE W REFLEX MICROSCOPIC
Glucose, UA: NEGATIVE mg/dL
KETONES UR: NEGATIVE mg/dL
NITRITE: NEGATIVE
Protein, ur: 100 mg/dL — AB
SPECIFIC GRAVITY, URINE: 1.024 (ref 1.005–1.030)
pH: 5 (ref 5.0–8.0)

## 2015-06-23 LAB — APTT: aPTT: 23 seconds — ABNORMAL LOW (ref 24–37)

## 2015-06-23 LAB — PROTIME-INR
INR: 1.57 — ABNORMAL HIGH (ref 0.00–1.49)
Prothrombin Time: 18.2 seconds — ABNORMAL HIGH (ref 11.6–15.2)

## 2015-06-23 MED ORDER — LORAZEPAM 2 MG/ML IJ SOLN
0.5000 mg | Freq: Four times a day (QID) | INTRAMUSCULAR | Status: DC | PRN
  Administered 2015-06-24 – 2015-06-25 (×4): 0.5 mg via INTRAVENOUS
  Filled 2015-06-23 (×4): qty 1

## 2015-06-23 MED ORDER — ONDANSETRON HCL 4 MG/2ML IJ SOLN: 4.0000 mg | Freq: Four times a day (QID) | INTRAMUSCULAR | Status: DC | PRN

## 2015-06-23 MED ORDER — ONDANSETRON HCL 4 MG PO TABS: 4.0000 mg | ORAL_TABLET | Freq: Four times a day (QID) | ORAL | Status: DC | PRN

## 2015-06-23 MED ORDER — DONEPEZIL HCL 5 MG PO TABS
10.0000 mg | ORAL_TABLET | Freq: Every day | ORAL | Status: DC
  Filled 2015-06-23: qty 2

## 2015-06-23 MED ORDER — ENOXAPARIN SODIUM 30 MG/0.3ML ~~LOC~~ SOLN
30.0000 mg | SUBCUTANEOUS | Status: DC
  Administered 2015-06-24 (×2): 30 mg via SUBCUTANEOUS
  Filled 2015-06-23 (×2): qty 0.3

## 2015-06-23 MED ORDER — MORPHINE SULFATE (PF) 2 MG/ML IV SOLN
1.0000 mg | INTRAVENOUS | Status: DC | PRN
  Administered 2015-06-24 – 2015-06-25 (×7): 1 mg via INTRAVENOUS
  Filled 2015-06-23 (×7): qty 1

## 2015-06-23 MED ORDER — DEXTROSE 5 % IV SOLN
2.0000 g | INTRAVENOUS | Status: DC
  Administered 2015-06-24: 2 g via INTRAVENOUS
  Filled 2015-06-23: qty 2

## 2015-06-23 MED ORDER — NITROGLYCERIN 0.4 MG SL SUBL: 0.4000 mg | SUBLINGUAL_TABLET | SUBLINGUAL | Status: DC | PRN

## 2015-06-23 MED ORDER — SODIUM CHLORIDE 0.9 % IV BOLUS (SEPSIS)
1000.0000 mL | Freq: Once | INTRAVENOUS | Status: AC
  Administered 2015-06-23: 1000 mL via INTRAVENOUS

## 2015-06-23 MED ORDER — SODIUM CHLORIDE 0.9% FLUSH
3.0000 mL | Freq: Two times a day (BID) | INTRAVENOUS | Status: DC
  Administered 2015-06-24 – 2015-06-26 (×6): 3 mL via INTRAVENOUS

## 2015-06-23 MED ORDER — ASPIRIN 300 MG RE SUPP
300.0000 mg | Freq: Every day | RECTAL | Status: DC
  Administered 2015-06-23 – 2015-06-24 (×2): 300 mg via RECTAL
  Filled 2015-06-23 (×2): qty 1

## 2015-06-23 MED ORDER — LEVOTHYROXINE SODIUM 50 MCG PO TABS
50.0000 ug | ORAL_TABLET | Freq: Every day | ORAL | Status: DC
  Filled 2015-06-23: qty 1

## 2015-06-23 MED ORDER — LORAZEPAM 2 MG/ML IJ SOLN
0.5000 mg | Freq: Once | INTRAMUSCULAR | Status: AC
  Administered 2015-06-23: 0.5 mg via INTRAVENOUS
  Filled 2015-06-23: qty 1

## 2015-06-23 MED ORDER — DEXTROSE 5 % IV SOLN
2.0000 g | Freq: Once | INTRAVENOUS | Status: AC
  Administered 2015-06-23: 2 g via INTRAVENOUS
  Filled 2015-06-23: qty 2

## 2015-06-23 MED ORDER — ACETAMINOPHEN 325 MG PO TABS: 650.0000 mg | ORAL_TABLET | Freq: Four times a day (QID) | ORAL | Status: DC | PRN

## 2015-06-23 MED ORDER — TRAMADOL HCL 50 MG PO TABS: 50.0000 mg | ORAL_TABLET | Freq: Four times a day (QID) | ORAL | Status: DC | PRN

## 2015-06-23 MED ORDER — ACETAMINOPHEN 650 MG RE SUPP: 650.0000 mg | Freq: Four times a day (QID) | RECTAL | Status: DC | PRN

## 2015-06-23 MED ORDER — FENTANYL CITRATE (PF) 100 MCG/2ML IJ SOLN
25.0000 ug | Freq: Once | INTRAMUSCULAR | Status: AC
Start: 2015-06-23 — End: 2015-06-23
  Administered 2015-06-23: 25 ug via INTRAVENOUS
  Filled 2015-06-23: qty 2

## 2015-06-23 MED ORDER — LEVOTHYROXINE SODIUM 100 MCG IV SOLR
25.0000 ug | Freq: Every day | INTRAVENOUS | Status: DC
  Administered 2015-06-24: 25 ug via INTRAVENOUS
  Filled 2015-06-23: qty 5

## 2015-06-23 MED ORDER — SODIUM CHLORIDE 0.9 % IV SOLN
1000.0000 mL | INTRAVENOUS | Status: DC
  Administered 2015-06-23 – 2015-06-24 (×4): 1000 mL via INTRAVENOUS

## 2015-06-23 NOTE — ED Notes (Signed)
Pt continuously pulling nasal cannula out of her nose.  Family present in room with patient.  Family presented DNR.  It is placed at beside.

## 2015-06-23 NOTE — ED Notes (Signed)
Bed: [facility] Expected date:  Expected time:  Means of arrival:  Comments: 

## 2015-06-23 NOTE — H&P (Addendum)
History and Physical    Marisa Sweeney ZOX:[id number] DOB: 26-Jul-1916 DOA: 07/12/2015  Referring MD/NP/PA:   PCP: Gaye Alken, MD   Patient coming from:  The patient is coming from home.  At baseline, she is dependent for most of her ADL.   Chief Complaint: Worsening mental status, UTI  HPI: Marisa Sweeney is a 82 y.o. female with medical history significant of dementia, hypertension, hyperlipidemia, GERD, hypothyroidism, CAD, myocardial infarction, SAH, aortic stenosis, heart hearing, who presents with worsening mental status and UTI.  Patient has dementia and worsening mental status, is unable to provide accurate medical history, therefore, most of the history is obtained by discussing the case with ED physician and her daughter.   Per patient's daughter, patient was found to have cloudy urine and bad urine smell in the past several days. She is more confused than her baseline. She was diagnosed with UTI by her PCP yesterday. She was given prescription of ciprofloxacin. Patient took 1 pill of ciprofloxacin. At baseline, patient has agitation due to dementia, but this has been worsening. She is screaming a lot. Not sure if patient has dysuria or burning on urination. Per her daughter, patient does not seem to have cough, chest pain or shortness of breath. No nausea, vomiting, diarrhea or abdominal pain. She moves all extremities normally.  ED Course: pt was found to have hypotension with blood pressure 78/57, which improved to SBP of ~90s after 2.5 L of NS in ED,  positive urinalysis with large amount of leukocytes, lactate 5.07, troponin elevation 0.25, WBC 17.1, temperature normal, tachycardia, tachypnea, acute renal injury with creatinine 1.54. Patient is admitted to stepdown bed for further eval and treatment.  Review of Systems: Could not be reviewed due to dementia and worsening mental status.  Allergy:  Allergies  Allergen Reactions  . Influenza Vaccines    Severe cold    Past Medical History  Diagnosis Date  . Hypertension   . Thyroid disease   . High cholesterol     h/o  . Heart murmur   . Myocardial infarction (HCC) 1995  . Hypothyroidism   . GERD (gastroesophageal reflux disease)   . Constipation   . Hemorrhoid     h/o  . SAH (subarachnoid hemorrhage) (HCC) 01/12/11    right frontal  . Dementia     Past Surgical History  Procedure Laterality Date  . Joint replacement    . Coronary angioplasty    . Total hip arthroplasty      right 1995; left 2000  . Tonsillectomy and adenoidectomy      "when I was little"    Social History:  reports that she has never smoked. She has never used smokeless tobacco. She reports that she does not drink alcohol or use illicit drugs.  Family History: Could not be reviewed due to dementia and worsening mental status.  Prior to Admission medications   Medication Sig Start Date End Date Taking? Authorizing Provider  ciprofloxacin (CIPRO) 250 MG tablet Take 250 mg by mouth 2 (two) times daily. 2015/07/12  Yes Historical Provider, MD  donepezil (ARICEPT) 10 MG tablet Take 10 mg by mouth at bedtime. 06/06/15  Yes Historical Provider, MD  levothyroxine (SYNTHROID, LEVOTHROID) 50 MCG tablet Take 50 mcg by mouth daily before breakfast.   Yes Historical Provider, MD  levothyroxine (SYNTHROID, LEVOTHROID) 75 MCG tablet Take 75 mcg by mouth daily before breakfast. Takes on Tues, Thurs, and Sat   Yes Historical Provider, MD  losartan (COZAAR) 50 MG  tablet Take 50 mg by mouth daily. Reported on 03/25/15   Yes Historical Provider, MD  NIFEdipine (PROCARDIA-XL/ADALAT-CC/NIFEDICAL-XL) 30 MG 24 hr tablet Take 30 mg by mouth daily. 05/30/15  Yes Historical Provider, MD  traMADol (ULTRAM) 50 MG tablet Take 50-100 mg by mouth every 6 (six) hours as needed (pain). Maximum dose= 8 tablets per day  For pain   Yes Historical Provider, MD  NIFEdipine (ADALAT CC) 90 MG 24 hr tablet Take 1 tablet (90 mg total) by mouth  daily. Patient not taking: Reported on 03/25/15 03/11/12   Pricilla RifflePaula Ross V, MD  simvastatin (ZOCOR) 40 MG tablet Take 1 tablet (40 mg total) by mouth every evening. Patient not taking: Reported on 03/28/2015 03/11/12   Pricilla RifflePaula Ross V, MD    Physical Exam: Filed Vitals:   08/28/2015 1915 08/28/2015 1930 08/28/2015 1942 08/28/2015 2058  BP: 78/57 96/70  89/68  Pulse: 115 85  113  Temp:   98.1 F (36.7 C)   TempSrc:   Axillary   Resp: 26 24  22   Height:      Weight:      SpO2: 96% 89%  95%   General: Not in acute distress HEENT:       Eyes: PERRL, EOMI, no scleral icterus.       ENT: No discharge from the ears and nose, no pharynx injection, no tonsillar enlargement.        Neck: No JVD, no bruit, no mass felt. Heme: No neck lymph node enlargement. Cardiac: S1/S2, RRR, No murmurs, No gallops or rubs. Pulm: No rales, wheezing, rhonchi or rubs. Abd: Soft, nondistended, nontender, no organomegaly, BS present. GU: No hematuria Ext: No pitting leg edema bilaterally. +DP/PT pulses present Musculoskeletal: No joint deformities, No joint redness or warmth, no limitation of ROM in spin. Skin: No rashes.  Neuro: confused, not oriented X3, cranial nerves II-XII grossly intact, moves all extremities normally. Psych: pt has agitation.  Labs on Admission: I have personally reviewed following labs and imaging studies  CBC:  Recent Labs Lab 08/28/2015 1752  WBC 17.1*  NEUTROABS 16.1*  HGB 13.7  HCT 41.8  MCV 98.1  PLT 143*   Basic Metabolic Panel:  Recent Labs Lab 08/28/2015 1752  NA 137  K 4.1  CL 102  CO2 24  GLUCOSE 164*  BUN 37*  CREATININE 1.54*  CALCIUM 8.6*   GFR: Estimated Creatinine Clearance: 18.5 mL/min (by C-G formula based on Cr of 1.54). Liver Function Tests:  Recent Labs Lab 08/28/2015 1752  AST 28  ALT 17  ALKPHOS 132*  BILITOT 1.0  PROT 5.7*  ALBUMIN 2.8*   No results for input(s): LIPASE, AMYLASE in the last 168 hours. No results for input(s): AMMONIA in the last  168 hours. Coagulation Profile: No results for input(s): INR, PROTIME in the last 168 hours. Cardiac Enzymes:  Recent Labs Lab 08/28/2015 1752 08/28/2015 2130  TROPONINI 0.25* 0.16*   BNP (last 3 results) No results for input(s): PROBNP in the last 8760 hours. HbA1C: No results for input(s): HGBA1C in the last 72 hours. CBG: No results for input(s): GLUCAP in the last 168 hours. Lipid Profile: No results for input(s): CHOL, HDL, LDLCALC, TRIG, CHOLHDL, LDLDIRECT in the last 72 hours. Thyroid Function Tests: No results for input(s): TSH, T4TOTAL, FREET4, T3FREE, THYROIDAB in the last 72 hours. Anemia Panel: No results for input(s): VITAMINB12, FOLATE, FERRITIN, TIBC, IRON, RETICCTPCT in the last 72 hours. Urine analysis:    Component Value Date/Time   COLORURINE  ORANGE* 07/13/15 1940   APPEARANCEUR TURBID* July 13, 2015 1940   LABSPEC 1.024 Jul 13, 2015 1940   PHURINE 5.0 07-13-2015 1940   GLUCOSEU NEGATIVE 07/13/2015 1940   HGBUR LARGE* 2015-07-13 1940   BILIRUBINUR SMALL* 07-13-2015 1940   KETONESUR NEGATIVE 07/13/2015 1940   PROTEINUR 100* 13-Jul-2015 1940   UROBILINOGEN 1.0 10/30/2013 1627   NITRITE NEGATIVE Jul 13, 2015 1940   LEUKOCYTESUR LARGE* 07-13-2015 1940   Sepsis Labs: @LABRCNTIP (procalcitonin:4,lacticidven:4) )No results found for this or any previous visit (from the past 240 hour(s)).   Radiological Exams on Admission: Dg Chest Port 1 View  13-Jul-2015  CLINICAL DATA:  82 year old with current history of dementia, currently being treated for urinary tract infection, presenting with acute mental status changes. EXAM: PORTABLE CHEST 1 VIEW COMPARISON:  03/28/2015 and earlier. FINDINGS: Cardiac silhouette mildly enlarged for AP portable technique, unchanged. Prominent bronchovascular markings diffusely, more so than on the prior examinations. Lungs otherwise clear. No confluent airspace consolidation. No pleural effusions. Pulmonary vascularity normal. IMPRESSION: Stable  mild cardiomegaly. Possible mild acute bronchitis. No acute cardiopulmonary disease otherwise. Electronically Signed   By: Hulan Saas M.D.   On: 07/13/2015 18:24     EKG: Independently reviewed. Sinus rhythm, old left bundle blockage, anteroseptal infarction pattern  Assessment/Plan   Principal Problem:   UTI (lower urinary tract infection) Active Problems:   LBBB (left bundle branch block)   HTN (hypertension)   Aortic stenosis   Dyslipidemia   Hearing loss of both ears   Hypertension   High cholesterol   Hypothyroidism   GERD (gastroesophageal reflux disease)   Dementia   Sepsis (HCC)   Elevated troponin   AKI (acute kidney injury) (HCC)  UTI and sepsis: Patient is a septic due to UTI, with elevated lactate 5.07, hypotension, tachycardia, tachypnea. Blood pressure responded to IV fluid, currently blood pressure is at SBP~90 mmHg. Pt is DNR, but per her daughter vasopressor can be given if needed.   - Admit to SDU as inpt - Ceftriaxone by IV - Follow up results of urine and blood cx and amend antibiotic regimen if needed per sensitivity results - prn Zofran for nausea - will get Procalcitonin and trend lactic acid levels per sepsis protocol. - IVF: 3L of NS bolus in ED, followed by 125 cc/h (BW=62 kg).   Addendum: Sepsis -Assessment:   Performed at:    At admission: 9:30 PM  Vitals     Blood pressure 96/70, pulse 85, temperature , resp. rate 24, height 5\' 4"  (1.626 m), weight 57.9 kg (127 lb 10.3 oz), SpO2 89 %.  Heart:     Regular rate and rhythm  Lungs:    CTA  Capillary Refill:   <2 sec  Peripheral Pulse:   Radial pulse palpable  Skin:     Pale   Addendum: -Lactic acid is trending down: 5.07 at 17:59-->3.29 at 21:58 PM-->1.5 at 3:32 AM 06/25/15 -MAP has been above 65 mmHg.   Acute encephalopathy and worsening mental status: Most likely due to sepsis and UTI. -Treated UTI as above -Frequent neuro check  Elevated trop and hx of CAD: pt has hx of  LBBB. Trop 0.25. Not very sure if pt has CP. This is likely due to demanding ischemia secondary to sepsis. - cycle CE q6 x3 and repeat her EKG in the am  - Nitroglycerin with holding Bp parameter - prn morphine if develops chest pain - Aspirin per recatal - Risk factor stratification: will check FLP, TSH and A1C  - 2d echo - please  call Card in AM  Hx of HTN: now has hypotesion -hold home blood pressure medications: Nifedipine and Cozaar  HLD: Last LDL was not on record -Hold home medications: Zocor due to altered mental status -Check FLP  Hypothyroidism: TSH is not on record. Per her daughter, patient has not been taking medications consistently. -Switch oral Synthroid to IV, at 25 g daily -Check TSH  Dementia with agitation: -Hold oral donepezil -When necessary Ativan for agitation  AKI: Likely due to prerenal secondary to dehydration and continuation of ARB. ATN is also possible due to hypotension - IVF as above - Check FeNa - Follow up renal function by BMP - Hold cozarr    DVT ppx: Q Lovenox Code Status: DNR. Per her daughter, vasopressor can be given if needed. Family Communication: Yes, patient's duagher at bed side Disposition Plan:  Anticipate discharge back to previous home environment Consults called:  none Admission status:  SDU/inpation       Date of Service 06/13/2015    Lorretta Harp [facility] Pager [phone]  If 7PM-7AM, please contact night-coverage [email or web address] Password [facility] 06/25/2015, 10:25 PM

## 2015-06-23 NOTE — ED Notes (Signed)
Hospitalist at bedside 

## 2015-06-23 NOTE — ED Notes (Signed)
Pt sleeping. 

## 2015-06-23 NOTE — ED Notes (Signed)
Pt has a hx of dementia and was dx with UTI yesterday at PCP office but has only taken one of her cipro pills today and has not taken any of her regular meds recently. Usually screams but today this is worse. Alert.

## 2015-06-23 NOTE — ED Provider Notes (Signed)
CSN: [id number]     Arrival date & time 2015-03-08  1714 History   First MD Initiated Contact with Patient 02017-02-03 1721     Chief Complaint  Patient presents with  . Altered Mental Status  PT HERE WITH ALTERED MENTAL STATUS.  THE PT HAS DEMENTIA AND WAS DX'D WITH AN UTI YESTERDAY.  PT HAS NOT WANTED TO TAKE ANY OF HER MEDS TODAY.  THE PT IS MORE AGITATED THAN NL.   (Consider location/radiation/quality/duration/timing/severity/associated sxs/prior Treatment) Patient is a 82 y.o. female presenting with altered mental status. The history is provided by the EMS personnel and a relative.  Altered Mental Status Presenting symptoms: behavior changes and combativeness   Severity:  Moderate Most recent episode:  Today Timing:  Constant Progression:  Unchanged Context: dementia     Past Medical History  Diagnosis Date  . Hypertension   . Thyroid disease   . High cholesterol     h/o  . Heart murmur   . Myocardial infarction (HCC) 1995  . Hypothyroidism   . GERD (gastroesophageal reflux disease)   . Constipation   . Hemorrhoid     h/o  . SAH (subarachnoid hemorrhage) (HCC) 01/12/11    right frontal  . Dementia    Past Surgical History  Procedure Laterality Date  . Joint replacement    . Coronary angioplasty    . Total hip arthroplasty      right 1995; left 2000  . Tonsillectomy and adenoidectomy      "when I was little"   No family history on file. Social History  Substance Use Topics  . Smoking status: Never Smoker   . Smokeless tobacco: Never Used  . Alcohol Use: No   OB History    No data available     Review of Systems  Unable to perform ROS: Dementia      Allergies  Influenza vaccines  Home Medications   Prior to Admission medications   Medication Sig Start Date End Date Taking? Authorizing Provider  ciprofloxacin (CIPRO) 250 MG tablet Take 250 mg by mouth 2 (two) times daily. 2015-03-08  Yes Historical Provider, MD  donepezil (ARICEPT) 10 MG tablet Take 10  mg by mouth at bedtime. 06/06/15  Yes Historical Provider, MD  levothyroxine (SYNTHROID, LEVOTHROID) 50 MCG tablet Take 50 mcg by mouth daily before breakfast.   Yes Historical Provider, MD  levothyroxine (SYNTHROID, LEVOTHROID) 75 MCG tablet Take 75 mcg by mouth daily before breakfast. Takes on Tues, Thurs, and Sat   Yes Historical Provider, MD  losartan (COZAAR) 50 MG tablet Take 50 mg by mouth daily. Reported on 07-31-15   Yes Historical Provider, MD  NIFEdipine (PROCARDIA-XL/ADALAT-CC/NIFEDICAL-XL) 30 MG 24 hr tablet Take 30 mg by mouth daily. 05/30/15  Yes Historical Provider, MD  traMADol (ULTRAM) 50 MG tablet Take 50-100 mg by mouth every 6 (six) hours as needed (pain). Maximum dose= 8 tablets per day  For pain   Yes Historical Provider, MD  NIFEdipine (ADALAT CC) 90 MG 24 hr tablet Take 1 tablet (90 mg total) by mouth daily. Patient not taking: Reported on 07-31-15 03/11/12   Pricilla RifflePaula Ross V, MD  simvastatin (ZOCOR) 40 MG tablet Take 1 tablet (40 mg total) by mouth every evening. Patient not taking: Reported on 03/28/2015 03/11/12   Pricilla RifflePaula Ross V, MD   BP 89/68 mmHg  Pulse 113  Temp(Src) 98.1 F (36.7 C) (Axillary)  Resp 22  Ht 5\' 4"  (1.626 m)  Wt 137 lb (62.143 kg)  BMI 23.50  kg/m2  SpO2 95% Physical Exam  Constitutional: She appears well-developed and well-nourished.  HENT:  Head: Normocephalic and atraumatic.  Right Ear: External ear normal.  Left Ear: External ear normal.  Nose: Nose normal.  Eyes: Conjunctivae and EOM are normal. Pupils are equal, round, and reactive to light.  Neck: Normal range of motion. Neck supple.  Cardiovascular: Regular rhythm.  Tachycardia present.   Pulmonary/Chest: Effort normal and breath sounds normal.  Abdominal: Soft. Bowel sounds are normal.  Musculoskeletal: Normal range of motion.  Neurological: She is alert.  Skin: Skin is warm and dry.  Psychiatric: She is agitated and combative.  Nursing note and vitals reviewed.   ED Course  Procedures  (including critical care time) Labs Review Labs Reviewed  COMPREHENSIVE METABOLIC PANEL - Abnormal; Notable for the following:    Glucose, Bld 164 (*)    BUN 37 (*)    Creatinine, Ser 1.54 (*)    Calcium 8.6 (*)    Total Protein 5.7 (*)    Albumin 2.8 (*)    Alkaline Phosphatase 132 (*)    GFR calc non Af Amer 27 (*)    GFR calc Af Amer 32 (*)    All other components within normal limits  CBC WITH DIFFERENTIAL/PLATELET - Abnormal; Notable for the following:    WBC 17.1 (*)    Platelets 143 (*)    Neutro Abs 16.1 (*)    Lymphs Abs 0.3 (*)    All other components within normal limits  URINALYSIS, ROUTINE W REFLEX MICROSCOPIC (NOT AT [facility]) - Abnormal; Notable for the following:    Color, Urine ORANGE (*)    APPearance TURBID (*)    Hgb urine dipstick LARGE (*)    Bilirubin Urine SMALL (*)    Protein, ur 100 (*)    Leukocytes, UA LARGE (*)    All other components within normal limits  TROPONIN I - Abnormal; Notable for the following:    Troponin I 0.25 (*)    All other components within normal limits  BLOOD GAS, VENOUS - Abnormal; Notable for the following:    pH, Ven 7.425 (*)    pCO2, Ven 38.0 (*)    Bicarbonate 24.5 (*)    All other components within normal limits  URINE MICROSCOPIC-ADD ON - Abnormal; Notable for the following:    Squamous Epithelial / LPF 0-5 (*)    Bacteria, UA MANY (*)    All other components within normal limits  I-STAT CG4 LACTIC ACID, ED - Abnormal; Notable for the following:    Lactic Acid, Venous 5.07 (*)    All other components within normal limits  CULTURE, BLOOD (ROUTINE X 2)  CULTURE, BLOOD (ROUTINE X 2)  URINE CULTURE  I-STAT CG4 LACTIC ACID, ED    Imaging Review Dg Chest Port 1 View  2015-07-11  CLINICAL DATA:  82 year old with current history of dementia, currently being treated for urinary tract infection, presenting with acute mental status changes. EXAM: PORTABLE CHEST 1 VIEW COMPARISON:  03/28/2015 and earlier. FINDINGS: Cardiac  silhouette mildly enlarged for AP portable technique, unchanged. Prominent bronchovascular markings diffusely, more so than on the prior examinations. Lungs otherwise clear. No confluent airspace consolidation. No pleural effusions. Pulmonary vascularity normal. IMPRESSION: Stable mild cardiomegaly. Possible mild acute bronchitis. No acute cardiopulmonary disease otherwise. Electronically Signed   By: Hulan Saas M.D.   On: 2015/07/11 18:24   I have personally reviewed and evaluated these images and lab results as part of my medical decision-making.  EKG Interpretation   Date/Time:  Sunday Jun 23 2015 18:20:12 EDT Ventricular Rate:  124 PR Interval:    QRS Duration: 120 QT Interval:  322 QTC Calculation: 462 R Axis:   -23 Text Interpretation:  Wide QRS tachycardia Septal infarct , age  undetermined ST \\T \ T wave abnormality, consider lateral ischemia Abnormal  ECG Confirmed by Cassandria Drew MD, Zaliyah Meikle ([id number]) on 06/19/2015 6:59:33 PM      MDM  BP IMPROVED WITH IVFS.  IT DROPPED AGAIN WITH ATIVAN GIVEN FOR AGITATION.  SHE IS RECEIVING MORE IVFS.  THE PT WAS D/W DR. Clyde Lundborg WHO WILL ADMIT PT. Final diagnoses:  Acute cystitis without hematuria  Sepsis, due to unspecified organism [facility])       Jacalyn Lefevre, MD 06/24/15 0001

## 2015-06-24 ENCOUNTER — Encounter (HOSPITAL_COMMUNITY): Payer: Self-pay

## 2015-06-24 ENCOUNTER — Inpatient Hospital Stay (HOSPITAL_COMMUNITY): Payer: Medicare Other

## 2015-06-24 DIAGNOSIS — L8990 Pressure ulcer of unspecified site, unspecified stage: Secondary | ICD-10-CM | POA: Insufficient documentation

## 2015-06-24 DIAGNOSIS — R079 Chest pain, unspecified: Secondary | ICD-10-CM

## 2015-06-24 LAB — LIPID PANEL
CHOL/HDL RATIO: 13.6 ratio
CHOLESTEROL: 109 mg/dL (ref 0–200)
HDL: 8 mg/dL — AB (ref >40)
LDL Cholesterol: 67 mg/dL (ref 0–99)
TRIGLYCERIDES: 168 mg/dL — AB (ref <150)
VLDL: 34 mg/dL (ref 0–40)

## 2015-06-24 LAB — BASIC METABOLIC PANEL
ANION GAP: 6 (ref 5–15)
BUN: 35 mg/dL — ABNORMAL HIGH (ref 6–20)
CHLORIDE: 113 mmol/L — AB (ref 101–111)
CO2: 22 mmol/L (ref 22–32)
Calcium: 7.4 mg/dL — ABNORMAL LOW (ref 8.9–10.3)
Creatinine, Ser: 1.25 mg/dL — ABNORMAL HIGH (ref 0.44–1.00)
GFR calc non Af Amer: 35 mL/min — ABNORMAL LOW (ref >60)
GFR, EST AFRICAN AMERICAN: 41 mL/min — AB (ref >60)
GLUCOSE: 175 mg/dL — AB (ref 65–99)
Potassium: 4.4 mmol/L (ref 3.5–5.1)
Sodium: 141 mmol/L (ref 135–145)

## 2015-06-24 LAB — ECHOCARDIOGRAM COMPLETE
Height: 64 in
Weight: 2042.34 oz

## 2015-06-24 LAB — CBC
HCT: 38.9 % (ref 36.0–46.0)
HEMOGLOBIN: 12.4 g/dL (ref 12.0–15.0)
MCH: 32.5 pg (ref 26.0–34.0)
MCHC: 31.9 g/dL (ref 30.0–36.0)
MCV: 101.8 fL — AB (ref 78.0–100.0)
Platelets: 125 10*3/uL — ABNORMAL LOW (ref 150–400)
RBC: 3.82 MIL/uL — AB (ref 3.87–5.11)
RDW: 15.6 % — ABNORMAL HIGH (ref 11.5–15.5)
WBC: 23.3 10*3/uL — ABNORMAL HIGH (ref 4.0–10.5)

## 2015-06-24 LAB — MRSA PCR SCREENING: MRSA by PCR: NEGATIVE

## 2015-06-24 LAB — TROPONIN I
TROPONIN I: 6.27 ng/mL — AB (ref <0.031)
Troponin I: 1.6 ng/mL (ref <0.031)

## 2015-06-24 LAB — GLUCOSE, CAPILLARY
GLUCOSE-CAPILLARY: 169 mg/dL — AB (ref 65–99)
GLUCOSE-CAPILLARY: 128 mg/dL — AB (ref 65–99)
Glucose-Capillary: 147 mg/dL — ABNORMAL HIGH (ref 65–99)

## 2015-06-24 LAB — TSH: TSH: 2.42 u[IU]/mL (ref 0.350–4.500)

## 2015-06-24 MED ORDER — SODIUM CHLORIDE 0.9 % IV SOLN: 1000.0000 mL | INTRAVENOUS | Status: DC

## 2015-06-24 MED ORDER — SODIUM CHLORIDE 0.9 % IV SOLN
INTRAVENOUS | Status: DC
  Administered 2015-06-24: 21:00:00 via INTRAVENOUS

## 2015-06-24 NOTE — Progress Notes (Signed)
Initial Nutrition Assessment  DOCUMENTATION CODES:   Not applicable  INTERVENTION:  -Ensure Enlive po BID, each supplement provides 350 kcal and 20 grams of protein with diet advancement -RD to continue to monitor  NUTRITION DIAGNOSIS:   Inadequate oral intake related to lethargy/confusion as evidenced by per patient/family report.  GOAL:   Patient will meet greater than or equal to 90% of their needs  MONITOR:   PO intake, Supplement acceptance, Labs, I & O's, Skin  REASON FOR ASSESSMENT:   Low Braden    ASSESSMENT:   Marisa Sweeney is a 82 y.o. female with medical history significant of dementia, hypertension, hyperlipidemia, GERD, hypothyroidism, CAD, myocardial infarction, SAH, aortic stenosis, heart hearing, who presents with worsening mental status and UTI.  Visited Marisa Sweeney at bedside, no family with hx of advanced dementia. Per RN, pt had received medication to calm her down prior to my visit. Patient was asleep, did not move.  Performed NFPE -> exhibited mild muscle wasting at lower extremities, mild muscle wasting at temples, WNL otherwise, no Edema.  Unable to retrieve  diet hx but per chart review pt has weighed as much as 63 kg as of 03/2012. She is down to 58 kg now. She is a DNR, would not be a candidate for nutrition support. Daughter did request pt receive pressors if necessary. Noted Stg 1 to Sacrum, currently NPO, will order Ensure when her diet is advanced -> monitor for intake.  Currently suffering from UTI -> Sepsis.  Labs and Medications reviewed:  CBGS 128-169; BUN 35, Cr 1.25, Ca 7.4, EGFR 35  Diet Order:  Diet NPO time specified Except for: Sips with Meds  Skin:  Wound (see comment) (Stg I To Sacrum)  Last BM:  5/21  Height:   Ht Readings from Last 1 Encounters:  06/24/15 5' 4"  (1.626 m)    Weight:   Wt Readings from Last 1 Encounters:  06/24/15 127 lb 10.3 oz (57.9 kg)    Ideal Body Weight:  54.54 kg  BMI:  Body mass  index is 21.9 kg/(m^2).  Estimated Nutritional Needs:   Kcal:  1150-1450  Protein:  60-70 grams  Fluid:  >/= 1.2L  EDUCATION NEEDS:   No education needs identified at this time  Satira Anis. Jacora Hopkins, MS, RD LDN Inpatient Clinical Dietitian Pager [phone]

## 2015-06-24 NOTE — Progress Notes (Signed)
CRITICAL VALUE ALERT  Critical value received:  Troponin 1.60  Date of notification:  06/24/15  Time of notification:  0515  Critical value read back:Yes.    Nurse who received alert:  Reuel BoomQueeneth Nusayba Cadenas, RN  MD notified (1st page):  Gerome ApleyLaura A. Harduk, PA  Time of first page:  0530  MD notified (2nd page):  Time of second page:  Responding MD:  Vernona RiegerLaura A. Harduk, PA  Time MD responded:  0600, order for EKG received

## 2015-06-24 NOTE — Progress Notes (Addendum)
PROGRESS NOTE  Drexel IhaHerline H Colina JYN:[id number] DOB: Apr 08, 1916 DOA: 06-01-15 PCP: Gaye AlkenBARNES,ELIZABETH STEWART, MD  HPI/Recap of past 24 hours:  Moaning when touched, not open eyes, not following command,    Assessment/Plan: Principal Problem:   UTI (lower urinary tract infection) Active Problems:   LBBB (left bundle branch block)   HTN (hypertension)   Aortic stenosis   Dyslipidemia   Hearing loss of both ears   Hypertension   High cholesterol   Hypothyroidism   GERD (gastroesophageal reflux disease)   Dementia   Sepsis (HCC)   Elevated troponin   AKI (acute kidney injury) (HCC)   Pressure ulcer  UTI and sepsis: sepsis presented on admission, Patient is septic due to UTI, with elevated lactate 5.07, hypotension, tachycardia, tachypnea. Blood pressure responded to IV fluid, currently blood pressure is at SBP~90 mmHg. Pt is DNR, but per her daughter vasopressor can be given if needed.  -Ceftriaxone by IV - Follow up results of urine and blood cx and amend antibiotic regimen if needed per sensitivity results - prn Zofran for nausea - will get Procalcitonin and trend lactic acid levels per sepsis protocol. - IVF: 3L of NS bolus in ED, followed by 125 cc/h (BW=62 kg)  Acute encephalopathy and worsening mental status with baseline dementia: Most likely due to sepsis and a UTI. -Treated UTI as above -Frequent neuro check  Elevated trop and hx of CAD/MI:  pt has hx of LBBB. Trop 0.25. Not very sure if pt has CP. This is likely due to demanding ischemia secondary to sepsis. - cycle CE q6 x3 and ekg no st elevation, patient is not a candidate for invasive procedure due to advance age and baseline dementia, not candidate for heparin due to h/o subarachnoid hemorrhage. - Nitroglycerin with holding Bp parameter - prn morphine if develops chest pain - Aspirin per recatal - Risk factor stratification: FLP ldl 67, TSH  2.4 , A1C  pending - 2d echo ordered - I have talked to the  daughter mary on the phone at [phone], she agree with current management, she does not wish to per sue aggressive treatment for NSTEMI .  Hx of HTN: now has hypotesion -hold home blood pressure medications: Nifedipine and Cozaar  HLD: Last LDL was not on record -Hold home medications: Zocor due to altered mental status -ldl 67  Hypothyroidism: TSH is not on record. Per her daughter, patient has not been taking medications consistently. -Switch oral Synthroid to IV, at 25 g daily - TSH 2.4  Dementia with agitation: -Hold oral donepezil -When necessary Ativan for agitation  AKI: cr baseline 0.7-0.8, 1.54 on admission Likely due to prerenal secondary to dehydration and continuation of ARB. ATN is also possible due to hypotension - IVF as above, Hold cozarr, treat uti -  Cr 1.54-1.25    DVT ppx: Q Lovenox Code Status: DNR. Per her daughter, vasopressor can be given if needed. Family Communication: Yes, patient's duagher over the phone Disposition Plan: pending clinical improvement, may consider home hospice if no meaningful clinical improvement, daughter agrees.  Consults called: none      Procedures:  none  Antibiotics:  rocephin   Objective: BP 126/73 mmHg  Pulse 104  Temp(Src) 98.2 F (36.8 C) (Axillary)  Resp 18  Ht 5\' 4"  (1.626 m)  Wt 57.9 kg (127 lb 10.3 oz)  BMI 21.90 kg/m2  SpO2 97%  Intake/Output Summary (Last 24 hours) at 06/24/15 [id number] Last data filed at 06/24/15 0600  Gross per 24  hour  Intake    878 ml  Output      0 ml  Net    878 ml   Filed Weights   06/03/2015 1901 06/24/15 0100  Weight: 62.143 kg (137 lb) 57.9 kg (127 lb 10.3 oz)    Exam:   General:  Frail , demented elderly female dose not open eyes, does not follow command  Cardiovascular: RRR  Respiratory: CTABL  Abdomen: Soft/ND/NT, positive BS  Musculoskeletal: No Edema  Neuro: dementia  Data Reviewed: Basic Metabolic Panel:  Recent Labs Lab 06/25/2015 1752  06/24/15 0315  NA 137 141  K 4.1 4.4  CL 102 113*  CO2 24 22  GLUCOSE 164* 175*  BUN 37* 35*  CREATININE 1.54* 1.25*  CALCIUM 8.6* 7.4*   Liver Function Tests:  Recent Labs Lab 06/17/2015 1752  AST 28  ALT 17  ALKPHOS 132*  BILITOT 1.0  PROT 5.7*  ALBUMIN 2.8*   No results for input(s): LIPASE, AMYLASE in the last 168 hours. No results for input(s): AMMONIA in the last 168 hours. CBC:  Recent Labs Lab 06/12/2015 1752 06/24/15 0315  WBC 17.1* 23.3*  NEUTROABS 16.1*  --   HGB 13.7 12.4  HCT 41.8 38.9  MCV 98.1 101.8*  PLT 143* 125*   Cardiac Enzymes:    Recent Labs Lab 06/07/2015 1752 06/04/2015 2130 06/24/15 0315  TROPONINI 0.25* 0.16* 1.60*   BNP (last 3 results) No results for input(s): BNP in the last 8760 hours.  ProBNP (last 3 results) No results for input(s): PROBNP in the last 8760 hours.  CBG:  Recent Labs Lab 06/24/15 0416  GLUCAP 169*    Recent Results (from the past 240 hour(s))  MRSA PCR Screening     Status: None   Collection Time: 06/24/15  2:24 AM  Result Value Ref Range Status   MRSA by PCR NEGATIVE NEGATIVE Final    Comment:        The GeneXpert MRSA Assay (FDA approved for NASAL specimens only), is one component of a comprehensive MRSA colonization surveillance program. It is not intended to diagnose MRSA infection nor to guide or monitor treatment for MRSA infections.      Studies: Dg Chest Port 1 View  06/24/2015  CLINICAL DATA:  82 year old with current history of dementia, currently being treated for urinary tract infection, presenting with acute mental status changes. EXAM: PORTABLE CHEST 1 VIEW COMPARISON:  03/28/2015 and earlier. FINDINGS: Cardiac silhouette mildly enlarged for AP portable technique, unchanged. Prominent bronchovascular markings diffusely, more so than on the prior examinations. Lungs otherwise clear. No confluent airspace consolidation. No pleural effusions. Pulmonary vascularity normal. IMPRESSION:  Stable mild cardiomegaly. Possible mild acute bronchitis. No acute cardiopulmonary disease otherwise. Electronically Signed   By: Hulan Saas M.D.   On: 06/16/2015 18:24    Scheduled Meds: . aspirin  300 mg Rectal Daily  . cefTRIAXone (ROCEPHIN)  IV  2 g Intravenous Q24H  . enoxaparin (LOVENOX) injection  30 mg Subcutaneous Q24H  . levothyroxine  25 mcg Intravenous Daily  . sodium chloride flush  3 mL Intravenous Q12H    Continuous Infusions: . sodium chloride 1,000 mL (06/24/15 0045)     Time spent:  Nyra Anspaugh MD, PhD  [facility] Pager [phone]. If 7PM-7AM, please contact night-coverage at [email or web address], password [facility] 06/24/2015, 8:22 AM  LOS: 1 day

## 2015-06-24 NOTE — Progress Notes (Signed)
PT Cancellation Note  Patient Details Name: Marisa Sweeney MRN: [id number] DOB: 1916/03/16   Cancelled Treatment:    Reason Eval/Treat Not Completed: Medical issues which prohibited therapy Pt is on strict bed rest,  Also presents with elevated troponin   Marisa Sweeney,Marisa Sweeney 06/24/2015, 8:47 AM Zenovia JarredKati Shaquoya Cosper, PT, DPT 06/24/2015 Pager: [phone]

## 2015-06-24 NOTE — Progress Notes (Signed)
  Echocardiogram 2D Echocardiogram has been performed.  Janalyn HarderWest, Yamir Carignan R 06/24/2015, 12:33 PM

## 2015-06-24 NOTE — Progress Notes (Signed)
Discussed with Merdis DelayK Schorr, NP how pt's telemetry reading is showing a heart rate of 150s for 3 beats x2 episodes within the last half hour.  Vitals stable.  Heart rate quickly drops back to baseline range of 90s-115s.  Telemetry indicates sinus tachycardia with PACs.  Pt resting comfortably.  No further orders at this time.  Will continue to monitor.  Barrie LymeVance, Atleigh Gruen E RN 8:40 PM 06/24/2015

## 2015-06-24 NOTE — Progress Notes (Signed)
OT Cancellation Note  Patient Details Name: Marisa Sweeney MRN: [id number] DOB: [birth year]   Cancelled Treatment:    Reason Eval/Treat Not Completed: Medical issues which prohibited therapy  Medical issues which prohibited therapy Pt is on strict bed rest, Also presents with elevated troponin  Marisa Sweeney, Marisa Sweeney 06/24/2015, 9:16 AM

## 2015-06-24 NOTE — Care Management Note (Signed)
Case Management Note  Patient Details  Name: Marisa Sweeney MRN: [id number] Date of Birth: August 20, 1916  Subjective/Objective:       uro sepsis             Action/Plan:Date:  Jun 24, 2015 Chart reviewed for concurrent status and case management needs. Will continue to follow patient for changes and needs: Expected discharge date: [id number] Marcelle SmilingRhonda Davis, BSN, [place]3, [place]   [phone]   Expected Discharge Date:                  Expected Discharge Plan:  Home/Self Care  In-House Referral:  NA  Discharge planning Services  CM Consult  Post Acute Care Choice:  NA Choice offered to:  NA  DME Arranged:  N/A DME Agency:  NA  HH Arranged:  NA HH Agency:  NA  Status of Service:  In process, will continue to follow  Medicare Important Message Given:    Date Medicare IM Given:    Medicare IM give by:    Date Additional Medicare IM Given:    Additional Medicare Important Message give by:     If discussed at Long Length of Stay Meetings, dates discussed:    Additional Comments:  Golda AcreDavis, Rhonda Lynn, RN 06/24/2015, 10:19 AM

## 2015-06-25 DIAGNOSIS — H9193 Unspecified hearing loss, bilateral: Secondary | ICD-10-CM

## 2015-06-25 DIAGNOSIS — Z515 Encounter for palliative care: Secondary | ICD-10-CM

## 2015-06-25 DIAGNOSIS — I214 Non-ST elevation (NSTEMI) myocardial infarction: Secondary | ICD-10-CM

## 2015-06-25 LAB — CBC
HEMATOCRIT: 43.1 % (ref 36.0–46.0)
Hemoglobin: 13.8 g/dL (ref 12.0–15.0)
MCH: 32.5 pg (ref 26.0–34.0)
MCHC: 32 g/dL (ref 30.0–36.0)
MCV: 101.4 fL — AB (ref 78.0–100.0)
Platelets: 127 10*3/uL — ABNORMAL LOW (ref 150–400)
RBC: 4.25 MIL/uL (ref 3.87–5.11)
RDW: 16.1 % — AB (ref 11.5–15.5)
WBC: 22.9 10*3/uL — ABNORMAL HIGH (ref 4.0–10.5)

## 2015-06-25 LAB — URINE CULTURE

## 2015-06-25 LAB — COMPREHENSIVE METABOLIC PANEL
ALT: 27 U/L (ref 14–54)
ANION GAP: 7 (ref 5–15)
AST: 87 U/L — ABNORMAL HIGH (ref 15–41)
Albumin: 2.2 g/dL — ABNORMAL LOW (ref 3.5–5.0)
Alkaline Phosphatase: 76 U/L (ref 38–126)
BILIRUBIN TOTAL: 0.8 mg/dL (ref 0.3–1.2)
BUN: 39 mg/dL — ABNORMAL HIGH (ref 6–20)
CO2: 20 mmol/L — ABNORMAL LOW (ref 22–32)
Calcium: 7.4 mg/dL — ABNORMAL LOW (ref 8.9–10.3)
Chloride: 117 mmol/L — ABNORMAL HIGH (ref 101–111)
Creatinine, Ser: 0.86 mg/dL (ref 0.44–1.00)
GFR, EST NON AFRICAN AMERICAN: 55 mL/min — AB (ref >60)
Glucose, Bld: 112 mg/dL — ABNORMAL HIGH (ref 65–99)
POTASSIUM: 4 mmol/L (ref 3.5–5.1)
Sodium: 144 mmol/L (ref 135–145)
TOTAL PROTEIN: 4.9 g/dL — AB (ref 6.5–8.1)

## 2015-06-25 LAB — SODIUM, URINE, RANDOM: Sodium, Ur: <10 mmol/L

## 2015-06-25 LAB — HEMOGLOBIN A1C
Hgb A1c MFr Bld: 6.2 % — ABNORMAL HIGH (ref 4.8–5.6)
Mean Plasma Glucose: 131 mg/dL

## 2015-06-25 LAB — GLUCOSE, CAPILLARY: GLUCOSE-CAPILLARY: 96 mg/dL (ref 65–99)

## 2015-06-25 LAB — CREATININE, URINE, RANDOM: CREATININE, URINE: 126.92 mg/dL

## 2015-06-25 LAB — LACTIC ACID, PLASMA: LACTIC ACID, VENOUS: 1.5 mmol/L (ref 0.5–2.0)

## 2015-06-25 LAB — TROPONIN I: Troponin I: 13.49 ng/mL (ref <0.031)

## 2015-06-25 MED ORDER — ACETAMINOPHEN 650 MG RE SUPP: 650.0000 mg | Freq: Four times a day (QID) | RECTAL | Status: DC | PRN

## 2015-06-25 MED ORDER — LORAZEPAM 1 MG PO TABS: 1.0000 mg | ORAL_TABLET | ORAL | Status: DC | PRN

## 2015-06-25 MED ORDER — MORPHINE SULFATE (PF) 2 MG/ML IV SOLN
1.0000 mg | INTRAVENOUS | Status: DC | PRN
  Administered 2015-06-25 – 2015-06-26 (×4): 1 mg via INTRAVENOUS
  Filled 2015-06-25 (×4): qty 1

## 2015-06-25 MED ORDER — LORAZEPAM 2 MG/ML IJ SOLN
1.0000 mg | INTRAMUSCULAR | Status: DC | PRN
  Administered 2015-06-25: 1 mg via INTRAVENOUS
  Filled 2015-06-25 (×2): qty 1

## 2015-06-25 MED ORDER — BISACODYL 10 MG RE SUPP: 10.0000 mg | Freq: Every day | RECTAL | Status: DC | PRN

## 2015-06-25 MED ORDER — MORPHINE SULFATE (CONCENTRATE) 10 MG/0.5ML PO SOLN
5.0000 mg | ORAL | Status: DC | PRN
  Administered 2015-06-25 – 2015-06-26 (×2): 5 mg via SUBLINGUAL

## 2015-06-25 MED ORDER — MORPHINE SULFATE (CONCENTRATE) 10 MG/0.5ML PO SOLN
5.0000 mg | ORAL | Status: DC | PRN
  Filled 2015-06-25 (×2): qty 0.5

## 2015-06-25 MED ORDER — LORAZEPAM 2 MG/ML PO CONC
1.0000 mg | ORAL | Status: DC | PRN
  Filled 2015-06-25: qty 0.5

## 2015-06-25 MED ORDER — ACETAMINOPHEN 325 MG PO TABS: 650.0000 mg | ORAL_TABLET | Freq: Four times a day (QID) | ORAL | Status: DC | PRN

## 2015-06-25 NOTE — Progress Notes (Signed)
PROGRESS NOTE  Marisa Sweeney:[id number] DOB: 04-29-1916 DOA: 2015/07/02 PCP: Gaye Alken, MD  HPI/Recap of past 24 hours:  Cr, lactic acid normalized, bp stable, on room air but troponin continue to trend up, patient does not show clinical improvement, she does not open eyes, not following commands, moaning when touched,  Family now want comfort measures   Assessment/Plan: Principal Problem:   UTI (lower urinary tract infection) Active Problems:   LBBB (left bundle branch block)   HTN (hypertension)   Aortic stenosis   Dyslipidemia   Hearing loss of both ears   Hypertension   High cholesterol   Hypothyroidism   GERD (gastroesophageal reflux disease)   Dementia   Sepsis (HCC)   Elevated troponin   AKI (acute kidney injury) (HCC)   Pressure ulcer  UTI and sepsis: sepsis presented on admission, Patient is septic due to UTI, with elevated lactate 5.07, hypotension, tachycardia, tachypnea. Blood pressure responded to IV fluid, currently blood pressure is at SBP~90 mmHg. Pt is DNR, but per her daughter vasopressor can be given if needed.  -Ceftriaxone by IV, off ivf - urine and blood cx pending,  - cr/lactic acid normalized  Acute encephalopathy and worsening mental status with baseline dementia: Most likely due to sepsis / UTI/NSTEMI -Treated UTI as above -infection seems has been controlled, but no clinical improvement, keep npo,   NSTEMI and hx of CAD/MI/LBBB  Not very sure if pt has CP. ekg no st elevation, bp stable, on room air  -patient is not a candidate for invasive procedure due to advance age and baseline dementia, not a candidate for heparin due to h/o subarachnoid hemorrhage. -  FLP ldl 67, TSH  2.4 , A1C 6.2 - 2d echo ED 25-30%, no prior echo to compare - troponin continue to increase, no meaningful clinical improvement though bp better, cr and lactic acid normalized, start comfort measures,  palliative care consulted  Hx of HTN:  presented with sepsis and  hypotesion -hold home blood pressure medications: Nifedipine and Cozaar - bp better  HLD: Last LDL was not on record -Hold home medications: Zocor due to altered mental status -ldl 67  Hypothyroidism: TSH is not on record. Per her daughter, patient has not been taking medications consistently. -Switch oral Synthroid to IV, at 25 g daily - TSH 2.4  Dementia with agitation: -Hold oral donepezil -When necessary Ativan for agitation  AKI: cr baseline 0.7-0.8, 1.54 on admission Likely due to prerenal secondary to dehydration and continuation of ARB. ATN is also possible due to hypotension - IVF as above, Hold cozarr, treat uti -  Cr 1.54-1.25-0.86    DVT ppx: subQ Lovenox d/ced, now on comfort measures Code Status: DNR.  Family Communication: Yes, patient's duagher in room Disposition Plan: start comfort measures on 07/04/22, in hospital death vs hospice facility   Consults called: palliative care      Procedures:  none  Antibiotics:  Rocephin from admission to 07/04/2022   Objective: BP 105/58 mmHg  Pulse 85  Temp(Src) 96.7 F (35.9 C) (Axillary)  Resp 32  Ht  (1.626 m)  Wt 57.9 kg (127 lb 10.3 oz)  BMI 21.90 kg/m2  SpO2 96%  Intake/Output Summary (Last 24 hours) at 07-04-2015 0846 Last data filed at 06/24/15 2352  Gross per 24 hour  Intake 1440.34 ml  Output    300 ml  Net 1140.34 ml   Filed Weights   07/02/2015 1901 06/24/15 0100  Weight: 62.143 kg (137 lb) 57.9 kg (  127 lb 10.3 oz)    Exam:   General:  Frail , demented elderly female dose not open eyes, does not follow command  Cardiovascular: RRR  Respiratory: CTABL  Abdomen: Soft/ND/NT, positive BS  Musculoskeletal: No Edema  Neuro: dementia  Data Reviewed: Basic Metabolic Panel:  Recent Labs Lab 06/03/2015 1752 06/24/15 0315 06/25/15 0332  NA 137 141 144  K 4.1 4.4 4.0  CL 102 113* 117*  CO2 24 22 20*  GLUCOSE 164* 175* 112*  BUN 37* 35* 39*   CREATININE 1.54* 1.25* 0.86  CALCIUM 8.6* 7.4* 7.4*   Liver Function Tests:  Recent Labs Lab 07/03/2015 1752 06/25/15 0332  AST 28 87*  ALT 17 27  ALKPHOS 132* 76  BILITOT 1.0 0.8  PROT 5.7* 4.9*  ALBUMIN 2.8* 2.2*   No results for input(s): LIPASE, AMYLASE in the last 168 hours. No results for input(s): AMMONIA in the last 168 hours. CBC:  Recent Labs Lab 07/03/2015 1752 06/24/15 0315 06/25/15 0332  WBC 17.1* 23.3* 22.9*  NEUTROABS 16.1*  --   --   HGB 13.7 12.4 13.8  HCT 41.8 38.9 43.1  MCV 98.1 101.8* 101.4*  PLT 143* 125* 127*   Cardiac Enzymes:    Recent Labs Lab 06/27/2015 1752 06/12/2015 2130 06/24/15 0315 06/24/15 0904 06/25/15 0332  TROPONINI 0.25* 0.16* 1.60* 6.27* 13.49*   BNP (last 3 results) No results for input(s): BNP in the last 8760 hours.  ProBNP (last 3 results) No results for input(s): PROBNP in the last 8760 hours.  CBG:  Recent Labs Lab 06/24/15 0416 06/24/15 0726 06/24/15 0840  GLUCAP 169* 147* 128*    Recent Results (from the past 240 hour(s))  MRSA PCR Screening     Status: None   Collection Time: 06/24/15  2:24 AM  Result Value Ref Range Status   MRSA by PCR NEGATIVE NEGATIVE Final    Comment:        The GeneXpert MRSA Assay (FDA approved for NASAL specimens only), is one component of a comprehensive MRSA colonization surveillance program. It is not intended to diagnose MRSA infection nor to guide or monitor treatment for MRSA infections.      Studies: No results found.  Scheduled Meds: . aspirin  300 mg Rectal Daily  . cefTRIAXone (ROCEPHIN)  IV  2 g Intravenous Q24H  . enoxaparin (LOVENOX) injection  30 mg Subcutaneous Q24H  . levothyroxine  25 mcg Intravenous Daily  . sodium chloride flush  3 mL Intravenous Q12H    Continuous Infusions: . sodium chloride Stopped (06/24/15 2251)     Time spent: 35mins  Quanasia Defino MD, PhD  [facility] Pager [phone]. If 7PM-7AM, please contact night-coverage at  [email or web address], password [email or web address] 06/25/2015, 8:46 AM  LOS: 2 days

## 2015-06-25 NOTE — Progress Notes (Signed)
OT Cancellation Note  Patient Details Name: Marisa Sweeney MRN: [id number] DOB: Jan 17, 1917   Cancelled Treatment:    Reason Eval/Treat Not Completed: Medical issues which prohibited therapy -- Troponin trending upward.  Dajanee Voorheis A 06/25/2015, 8:32 AM

## 2015-06-25 NOTE — Progress Notes (Signed)
Informed Kirtland BouchardK. Schorr NP of troponin 13.49.  Pt resting comfortably.  Will continue to monitor.  Barrie LymeVance, Kierstin January E RN 0400 06/25/2015

## 2015-06-25 NOTE — Progress Notes (Signed)
PT Cancellation Note  Patient Details Name: Marisa Sweeney MRN: [id number] DOB: 08-24-1916   Cancelled Treatment:    Reason Eval/Treat Not Completed: Medical issues which prohibited therapy Troponin trending upward.   [name],[name] 06/25/2015, 8:26 AM Zenovia JarredKati Elim Economou, PT, DPT 06/25/2015 Pager: [phone]

## 2015-06-26 DIAGNOSIS — Z789 Other specified health status: Secondary | ICD-10-CM

## 2015-06-26 DIAGNOSIS — N390 Urinary tract infection, site not specified: Secondary | ICD-10-CM

## 2015-06-26 DIAGNOSIS — N179 Acute kidney failure, unspecified: Secondary | ICD-10-CM

## 2015-06-26 MED ORDER — SODIUM CHLORIDE 0.9 % IV SOLN
INTRAVENOUS | Status: DC
  Administered 2015-06-26: 12:00:00 via INTRAVENOUS

## 2015-06-26 MED ORDER — MORPHINE BOLUS VIA INFUSION
2.0000 mg | INTRAVENOUS | Status: DC | PRN
  Filled 2015-06-26: qty 2

## 2015-06-26 MED ORDER — GLYCOPYRROLATE 0.2 MG/ML IJ SOLN
0.2000 mg | INTRAMUSCULAR | Status: DC | PRN
  Filled 2015-06-26: qty 1

## 2015-06-26 MED ORDER — LORAZEPAM 2 MG/ML IJ SOLN
1.0000 mg | Freq: Four times a day (QID) | INTRAMUSCULAR | Status: DC | PRN
  Administered 2015-06-26: 1 mg via INTRAVENOUS

## 2015-06-26 MED ORDER — BIOTENE DRY MOUTH MT LIQD: 15.0000 mL | OROMUCOSAL | Status: DC | PRN

## 2015-06-26 MED ORDER — GLYCOPYRROLATE 1 MG PO TABS
1.0000 mg | ORAL_TABLET | ORAL | Status: DC | PRN
  Filled 2015-06-26: qty 1

## 2015-06-26 MED ORDER — POLYVINYL ALCOHOL 1.4 % OP SOLN
1.0000 [drp] | Freq: Four times a day (QID) | OPHTHALMIC | Status: DC | PRN
  Filled 2015-06-26: qty 15

## 2015-06-26 MED ORDER — HALOPERIDOL LACTATE 5 MG/ML IJ SOLN: 2.0000 mg | Freq: Four times a day (QID) | INTRAMUSCULAR | Status: DC | PRN

## 2015-06-26 MED ORDER — SODIUM CHLORIDE 0.9 % IV SOLN
2.0000 mg/h | INTRAVENOUS | Status: DC
  Administered 2015-06-26: 2 mg/h via INTRAVENOUS
  Filled 2015-06-26: qty 10

## 2015-06-26 NOTE — Care Management Important Message (Signed)
Important Message  Patient Details  Name: Marisa Sweeney MRN: [id number] Date of Birth: 1916-04-01   Medicare Important Message Given:  Yes    Haskell FlirtJamison, Beaux Wedemeyer 06/26/2015, 10:16 AMImportant Message  Patient Details  Name: Marisa Sweeney MRN: [id number] Date of Birth: 1916-04-01   Medicare Important Message Given:  Yes    Haskell FlirtJamison, Adonus Uselman 06/26/2015, 10:16 AM

## 2015-06-26 NOTE — Progress Notes (Signed)
Patient with increased agitation, terminal delirium this AM. I spoke with her daughter Dillard EssexMary-goals are full comfort. Will start Morphine infusion at 2mg /hr with bolus dosing for comfort. Will use ativan for agitation. I suspect she will have a hospital death within 324 hours-daughter Corrie DandyMary would prefer not to move her if possible but I discussed the option of residential hospice care if her mom stabilizes and can transport. Full consult note to follow.  Anderson MaltaElizabeth Elgar Scoggins, DO Palliative Medicine

## 2015-06-26 NOTE — Progress Notes (Signed)
PROGRESS NOTE  Marisa Sweeney HKV:[id number] DOB: 1916/05/06 DOA: 5/21/Marisa Iha2017 PCP: Gaye AlkenBARNES,ELIZABETH STEWART, MD  HPI/Recap of past 24 hours: Marisa Sweeney is a 82 y.o. female with medical history significant of dementia, hypertension, hyperlipidemia, GERD, hypothyroidism, CAD, myocardial infarction, SAH, aortic stenosis, heart of  hearing, who presents with worsening mental status and UTI. Patient was treated for sepsis, encephalopathy with IV fluids, IV antibiotics. Patient didn't improved clinically, after conversation with family it was decide to focus on comfort care. Palliative care consulted and helping with care.     Assessment/Plan: Principal Problem:   UTI (lower urinary tract infection) Active Problems:   LBBB (left bundle branch block)   HTN (hypertension)   Aortic stenosis   Dyslipidemia   Hearing loss of both ears   Hypertension   High cholesterol   Hypothyroidism   GERD (gastroesophageal reflux disease)   Dementia   Sepsis (HCC)   Elevated troponin   AKI (acute kidney injury) (HCC)   Pressure ulcer   UTI and sepsis: sepsis presented on admission, Patient  septic due to UTI, with elevated lactate 5.07, hypotension, tachycardia, tachypnea. Blood pressure responded to IV fluid. Patient was treated with IV fluids, IV antibiotics. Her medical condition didnt improved. Dr Mosetta PuttFeng discussed with family an decision was to focus on comfort and palliative care was consulted.  See full consult note from Dr Phillips OdorGolding.   Acute encephalopathy and worsening mental status with baseline dementia: Most likely due to sepsis / UTI/NSTEMI -Treated UTI as above -infection seems has been controlled, but no clinical improvement. Comfort care.   NSTEMI and hx of CAD/MI/LBBB  ekg no st elevation, bp stable, on room air .  Might be related to sepsis.  Comfort care now.  -patient is not a candidate for invasive procedure due to advance age and baseline dementia, not a candidate for  heparin due to h/o subarachnoid hemorrhage. -  FLP ldl 67, TSH  2.4 , A1C 6.2 - 2d echo ED 25-30%, no prior echo to compare - troponin continue to increase, no meaningful clinical improvement though bp better, cr and lactic acid normalized,  Transition to comfort care.   Hx of HTN: presented with sepsis and  hypotesion -hold home blood pressure medications: Nifedipine and Cozaar - bp better  HLD: Last LDL was not on record -Hold home medications: Zocor due to altered mental status -ldl 67  Hypothyroidism:  Per her daughter, patient has not been taking medications consistently. - TSH 2.4  Dementia with agitation: -Hold oral donepezil -When necessary Ativan for agitation   AKI: cr baseline 0.7-0.8, 1.54 on admission Likely due to prerenal secondary to dehydration and continuation of ARB. ATN is also possible due to hypotension - IVF as above, Hold cozarr, treat uti -  Cr 1.54-1.25-0.86    DVT ppx: subQ Lovenox d/ced, now on comfort measures Code Status: DNR.  Family Communication: none at bedside.  Disposition Plan: in hospital death vs hospice facility   Consults called: palliative care      Procedures:  none  Antibiotics:  Rocephin from admission to 5/23   Objective: BP 111/78 mmHg  Pulse 93  Temp(Src) 97.5 F (36.4 C) (Axillary)  Resp 18  Ht 5\' 4"  (1.626 m)  Wt 57.9 kg (127 lb 10.3 oz)  BMI 21.90 kg/m2  SpO2 94% No intake or output data in the 24 hours ending 06/26/15 1342 Filed Weights   07-18-2015 1901 06/24/15 0100  Weight: 62.143 kg (137 lb) 57.9 kg (127 lb 10.3 oz)  Exam:   General:  Frail , demented elderly female dose not open eyes, does not follow command, appears in pain  Cardiovascular: RRR  Respiratory: CTABL  Abdomen: Soft/ND/NT, positive BS  Musculoskeletal: No Edema  Neuro: dementia  Data Reviewed: Basic Metabolic Panel:  Recent Labs Lab 06/10/2015 1752 06/24/15 0315 06/25/15 0332  NA 137 141 144  K 4.1 4.4 4.0    CL 102 113* 117*  CO2 24 22 20*  GLUCOSE 164* 175* 112*  BUN 37* 35* 39*  CREATININE 1.54* 1.25* 0.86  CALCIUM 8.6* 7.4* 7.4*   Liver Function Tests:  Recent Labs Lab 06/04/2015 1752 06/25/15 0332  AST 28 87*  ALT 17 27  ALKPHOS 132* 76  BILITOT 1.0 0.8  PROT 5.7* 4.9*  ALBUMIN 2.8* 2.2*   No results for input(s): LIPASE, AMYLASE in the last 168 hours. No results for input(s): AMMONIA in the last 168 hours. CBC:  Recent Labs Lab 06/16/2015 1752 06/24/15 0315 06/25/15 0332  WBC 17.1* 23.3* 22.9*  NEUTROABS 16.1*  --   --   HGB 13.7 12.4 13.8  HCT 41.8 38.9 43.1  MCV 98.1 101.8* 101.4*  PLT 143* 125* 127*   Cardiac Enzymes:    Recent Labs Lab 06/03/2015 1752 06/22/2015 2130 06/24/15 0315 06/24/15 0904 06/25/15 0332  TROPONINI 0.25* 0.16* 1.60* 6.27* 13.49*   BNP (last 3 results) No results for input(s): BNP in the last 8760 hours.  ProBNP (last 3 results) No results for input(s): PROBNP in the last 8760 hours.  CBG:  Recent Labs Lab 06/24/15 0416 06/24/15 0726 06/24/15 0840 06/25/15 0852  GLUCAP 169* 147* 128* 96    Recent Results (from the past 240 hour(s))  Blood Culture (routine x 2)     Status: None (Preliminary result)   Collection Time: 06/22/2015  5:50 PM  Result Value Ref Range Status   Specimen Description BLOOD RIGHT ANTECUBITAL  Final   Special Requests BOTTLES DRAWN AEROBIC AND ANAEROBIC 10CC  Final   Culture   Final    NO GROWTH 1 DAY Performed at [facility]    Report Status PENDING  Incomplete  Blood Culture (routine x 2)     Status: None (Preliminary result)   Collection Time: 06/20/2015  6:30 PM  Result Value Ref Range Status   Specimen Description BLOOD LEFT ANTECUBITAL  Final   Special Requests BOTTLES DRAWN AEROBIC AND ANAEROBIC 10CC  Final   Culture   Final    NO GROWTH 1 DAY Performed at [facility]    Report Status PENDING  Incomplete  Urine culture     Status: Abnormal   Collection Time: 06/04/2015  7:40  PM  Result Value Ref Range Status   Specimen Description URINE, CATHETERIZED  Final   Special Requests NONE  Final   Culture MULTIPLE SPECIES PRESENT, SUGGEST RECOLLECTION (A)  Final   Report Status 06/25/2015 FINAL  Final  MRSA PCR Screening     Status: None   Collection Time: 06/24/15  2:24 AM  Result Value Ref Range Status   MRSA by PCR NEGATIVE NEGATIVE Final    Comment:        The GeneXpert MRSA Assay (FDA approved for NASAL specimens only), is one component of a comprehensive MRSA colonization surveillance program. It is not intended to diagnose MRSA infection nor to guide or monitor treatment for MRSA infections.      Studies: No results found.  Scheduled Meds: . sodium chloride flush  3 mL Intravenous Q12H  Continuous Infusions: . sodium chloride 20 mL/hr at 06/26/15 1139  . morphine 2 mg/hr (06/26/15 1143)     Time spent:  Alba Cory MD, PhD  [facility] Pager [phone]. If 7PM-7AM, please contact night-coverage at [email or web address], password [facility] 06/26/2015, 1:42 PM  LOS: 3 days

## 2015-06-26 NOTE — Progress Notes (Signed)
Nutrition Brief Note  Chart reviewed. Pt now transitioning to comfort care.  No further nutrition interventions warranted at this time.  Please consult as needed.   Jamarien Rodkey, MS, RD, LDN Pager: [phone] After Hours Pager: [phone]    

## 2015-06-26 NOTE — Progress Notes (Signed)
Pt from home with daughter.  Awaiting Palliative consult to assist with DC planning.  MD note from 06/25/15 states Hospital death vs Residential Hospice.  CM will continue to follow and assist as needed. Sandford Crazeora Corrigan Kretschmer RN,BSN,NCM [phone]

## 2015-06-27 NOTE — Progress Notes (Signed)
PROGRESS NOTE  Marisa IhaHerline H Cottier ZOX:[id number] DOB: Dec 30, 1916 DOA: 07/03/2015 PCP: Gaye AlkenBARNES,ELIZABETH STEWART, MD  HPI/Recap of past 24 hours: Marisa Sweeney is a 82 y.o. female with medical history significant of dementia, hypertension, hyperlipidemia, GERD, hypothyroidism, CAD, myocardial infarction, SAH, aortic stenosis, heart of  hearing, who presents with worsening mental status and UTI. Patient was treated for sepsis, encephalopathy with IV fluids, IV antibiotics. Patient didn't improved clinically, after conversation with family it was decide to focus on comfort care. Palliative care consulted and helping with care.     Assessment/Plan: Principal Problem:   UTI (lower urinary tract infection) Active Problems:   LBBB (left bundle branch block)   HTN (hypertension)   Aortic stenosis   Dyslipidemia   Hearing loss of both ears   Hypertension   High cholesterol   Hypothyroidism   GERD (gastroesophageal reflux disease)   Dementia   Sepsis (HCC)   Elevated troponin   AKI (acute kidney injury) (HCC)   Pressure ulcer   UTI and sepsis: sepsis presented on admission, Patient  septic due to UTI, with elevated lactate 5.07, hypotension, tachycardia, tachypnea. Blood pressure responded to IV fluid. Patient was treated with IV fluids, IV antibiotics. Her medical condition didnt improved. Dr Mosetta PuttFeng discussed with family an decision was to focus on comfort and palliative care was consulted.  See full consult note from Dr Phillips OdorGolding.  Patient is full comfort care. Patient appears more comfortable today after she was started on morphine gt.  Patient vitals not stable for transportation to Residential home facility.    Acute encephalopathy and worsening mental status with baseline dementia: Most likely due to sepsis UTI/NSTEMI -Treated UTI as above -infection seems has been controlled, but no clinical improvement. Comfort care.   NSTEMI and hx of CAD/MI/LBBB  ekg no st elevation, bp  stable, on room air .  Might be related to sepsis.  Comfort care now.  -patient is not a candidate for invasive procedure due to advance age and baseline dementia, not a candidate for heparin due to h/o subarachnoid hemorrhage. -  FLP ldl 67, TSH  2.4 , A1C 6.2 - 2d echo ED 25-30%, no prior echo to compare - troponin continue to increase, no meaningful clinical improvement though bp better, cr and lactic acid normalized,  Transition to comfort care.   Hx of HTN: presented with sepsis and  hypotesion -hold home blood pressure medications: Nifedipine and Cozaar - bp better  HLD: Last LDL was not on record -Hold home medications: Zocor due to altered mental status -ldl 67  Hypothyroidism:  Per her daughter, patient has not been taking medications consistently. - TSH 2.4  Dementia with agitation: -Hold oral donepezil -When necessary Ativan for agitation   AKI: cr baseline 0.7-0.8, 1.54 on admission Likely due to prerenal secondary to dehydration and continuation of ARB. ATN is also possible due to hypotension - IVF as above, Hold cozarr, treat uti -  Cr 1.54-1.25-0.86    DVT ppx: subQ Lovenox d/ced, now on comfort measures Code Status: DNR.  Family Communication: daughter at bedside.  Disposition Plan: in hospital death vs hospice facility   Consults called: palliative care      Procedures:  none  Antibiotics:  Rocephin from admission to 5/23   Objective: BP 71/32 mmHg  Pulse 65  Temp(Src) 98.2 F (36.8 C) (Axillary)  Resp 8  Ht 5\' 4"  (1.626 m)  Wt 57.9 kg (127 lb 10.3 oz)  BMI 21.90 kg/m2  SpO2 79% No intake or  output data in the 24 hours ending 06/27/15 1435 Filed Weights   06/16/2015 1901 06/24/15 0100  Weight: 62.143 kg (137 lb) 57.9 kg (127 lb 10.3 oz)    Exam:   General:  Frail , demented elderly female dose not open eyes, does not follow command, appears in pain  Cardiovascular: RRR  Respiratory: CTABL  Abdomen: Soft/ND/NT, positive  BS  Musculoskeletal: No Edema  Neuro: dementia  Data Reviewed: Basic Metabolic Panel:  Recent Labs Lab 06/08/2015 1752 06/24/15 0315 06/25/15 0332  NA 137 141 144  K 4.1 4.4 4.0  CL 102 113* 117*  CO2 24 22 20*  GLUCOSE 164* 175* 112*  BUN 37* 35* 39*  CREATININE 1.54* 1.25* 0.86  CALCIUM 8.6* 7.4* 7.4*   Liver Function Tests:  Recent Labs Lab 06/10/2015 1752 06/25/15 0332  AST 28 87*  ALT 17 27  ALKPHOS 132* 76  BILITOT 1.0 0.8  PROT 5.7* 4.9*  ALBUMIN 2.8* 2.2*   No results for input(s): LIPASE, AMYLASE in the last 168 hours. No results for input(s): AMMONIA in the last 168 hours. CBC:  Recent Labs Lab 06/04/2015 1752 06/24/15 0315 06/25/15 0332  WBC 17.1* 23.3* 22.9*  NEUTROABS 16.1*  --   --   HGB 13.7 12.4 13.8  HCT 41.8 38.9 43.1  MCV 98.1 101.8* 101.4*  PLT 143* 125* 127*   Cardiac Enzymes:    Recent Labs Lab 06/04/2015 1752 06/03/2015 2130 06/24/15 0315 06/24/15 0904 06/25/15 0332  TROPONINI 0.25* 0.16* 1.60* 6.27* 13.49*   BNP (last 3 results) No results for input(s): BNP in the last 8760 hours.  ProBNP (last 3 results) No results for input(s): PROBNP in the last 8760 hours.  CBG:  Recent Labs Lab 06/24/15 0416 06/24/15 0726 06/24/15 0840 06/25/15 0852  GLUCAP 169* 147* 128* 96    Recent Results (from the past 240 hour(s))  Blood Culture (routine x 2)     Status: None (Preliminary result)   Collection Time: 06/29/2015  5:50 PM  Result Value Ref Range Status   Specimen Description BLOOD RIGHT ANTECUBITAL  Final   Special Requests BOTTLES DRAWN AEROBIC AND ANAEROBIC 10CC  Final   Culture   Final    NO GROWTH 2 DAYS Performed at [facility]    Report Status PENDING  Incomplete  Blood Culture (routine x 2)     Status: None (Preliminary result)   Collection Time: 07/02/2015  6:30 PM  Result Value Ref Range Status   Specimen Description BLOOD LEFT ANTECUBITAL  Final   Special Requests BOTTLES DRAWN AEROBIC AND ANAEROBIC 10CC   Final   Culture   Final    NO GROWTH 2 DAYS Performed at [facility]    Report Status PENDING  Incomplete  Urine culture     Status: Abnormal   Collection Time: 06/22/2015  7:40 PM  Result Value Ref Range Status   Specimen Description URINE, CATHETERIZED  Final   Special Requests NONE  Final   Culture MULTIPLE SPECIES PRESENT, SUGGEST RECOLLECTION (A)  Final   Report Status 06/25/2015 FINAL  Final  MRSA PCR Screening     Status: None   Collection Time: 06/24/15  2:24 AM  Result Value Ref Range Status   MRSA by PCR NEGATIVE NEGATIVE Final    Comment:        The GeneXpert MRSA Assay (FDA approved for NASAL specimens only), is one component of a comprehensive MRSA colonization surveillance program. It is not intended to diagnose MRSA infection  nor to guide or monitor treatment for MRSA infections.      Studies: No results found.  Scheduled Meds: . sodium chloride flush  3 mL Intravenous Q12H    Continuous Infusions: . sodium chloride 20 mL/hr at 06/26/15 1139  . morphine 2 mg/hr (06/26/15 1143)     Time spent:  Alba Cory MD, PhD  [facility] Pager [phone]. If 7PM-7AM, please contact night-coverage at [email or web address], password [facility] 06/27/2015, 2:35 PM  LOS: 4 days

## 2015-06-28 DIAGNOSIS — F0390 Unspecified dementia without behavioral disturbance: Secondary | ICD-10-CM

## 2015-06-29 LAB — CULTURE, BLOOD (ROUTINE X 2)
CULTURE: NO GROWTH
Culture: NO GROWTH

## 2015-07-04 NOTE — Progress Notes (Signed)
Daily Progress Note   Patient Name: Marisa Sweeney       Date: 06/08/2015 DOB: [birth year]  Age: 82 y.o. MRN#: [id number] Attending Physician: Alba Cory, MD Primary Care Physician: Gaye Alken, MD Admit Date: July 22, 2015  Reason for Consultation/Follow-up: Terminal Care  Subjective: Unresponsive, comfortable on morphine infusion  Length of Stay: 5  Current Medications: Scheduled Meds:  . sodium chloride flush  3 mL Intravenous Q12H    Continuous Infusions: . sodium chloride 20 mL/hr at 06/26/15 1139  . morphine 4 mg/hr (06/27/15 1822)    PRN Meds: [DISCONTINUED] acetaminophen **OR** acetaminophen, antiseptic oral rinse, bisacodyl, glycopyrrolate **OR** glycopyrrolate **OR** glycopyrrolate, haloperidol lactate, LORazepam, morphine, nitroGLYCERIN, ondansetron **OR** ondansetron (ZOFRAN) IV, polyvinyl alcohol  Physical Exam          Vital Signs: BP 71/32 mmHg  Pulse 65  Temp(Src) 98.2 F (36.8 C) (Axillary)  Resp 8  Ht  (1.626 m)  Wt 57.9 kg (127 lb 10.3 oz)  BMI 21.90 kg/m2  SpO2 79% SpO2: SpO2: (!) 79 % O2 Device: O2 Device: Not Delivered O2 Flow Rate: O2 Flow Rate (L/min): 2 L/min  Intake/output summary:  Intake/Output Summary (Last 24 hours) at 06/27/2015 0955 Last data filed at 06/08/2015 0600  Gross per 24 hour  Intake 583.38 ml  Output      0 ml  Net 583.38 ml   LBM: Last BM Date: 07-22-2015 Baseline Weight: Weight: 62.143 kg (137 lb) Most recent weight: Weight: 57.9 kg (127 lb 10.3 oz)       Palliative Assessment/Data:    Flowsheet Rows        Most Recent Value   Intake Tab    Referral Department  Hospitalist   Unit at Time of Referral  Oncology Unit   Palliative Care Primary Diagnosis  Cardiac   Date Notified  06/25/15   Palliative Care Type  New Palliative care   Reason for referral  Clarify Goals of Care   Date of Admission  July 22, 2015   Date first seen by Palliative Care  06/25/15   # of days Palliative referral response time  0 Day(s)   # of days IP prior to Palliative referral  2   Clinical Assessment    Psychosocial & Spiritual Assessment    Palliative Care Outcomes  Patient Active Problem List   Diagnosis Date Noted  . Pressure ulcer 06/24/2015  . UTI (lower urinary tract infection) 02-27-2015  . Sepsis (HCC) 02-27-2015  . Elevated troponin 02-27-2015  . AKI (acute kidney injury) (HCC) 02-27-2015  . Hypertension   . High cholesterol   . Hypothyroidism   . GERD (gastroesophageal reflux disease)   . Dementia   . Acute cystitis without hematuria   . Thyroid activity decreased   . Hearing loss of both ears 04/21/2011  . LBBB (left bundle branch block) 02/23/2011  . HTN (hypertension) 02/23/2011  . Aortic stenosis 02/23/2011  . Dyslipidemia 02/23/2011    Palliative Care Assessment & Plan   Patient Profile: 82 yo with advanced dementia and complicated UTI.  Assessment: Actively dying-full comfort care. Patient had O2 replaced at some point-I have removed this since it does not contribute to comfort and may unnecessarily prolong her life. I now expect EOL within the next couple of hours.    Recommendations/Plan:  Remove O2-Family in full aggreement  Maintain morphine infusion  Goals of Care and Additional Recommendations:  Limitations on Scope of Treatment: Full Comfort Care  Code Status:    Code Status Orders        Start     Ordered   06/26/15 1040  Do not attempt resuscitation (DNR)   Continuous    Question Answer Comment  In the event of cardiac or respiratory ARREST Do not call a "code blue"   In the event of cardiac or respiratory ARREST Do not perform Intubation, CPR, defibrillation or ACLS   In the event of cardiac or respiratory ARREST Use medication by any  route, position, wound care, and other measures to relive pain and suffering. May use oxygen, suction and manual treatment of airway obstruction as needed for comfort.      06/26/15 1040    Code Status History    Date Active Date Inactive Code Status Order ID Comments User Context   06/25/2015  9:22 AM 06/26/2015 10:40 AM DNR [id number]  Albertine GratesFang Xu, MD Inpatient   Jun 27, 2015  9:34 PM 06/25/2015  9:22 AM DNR [id number]  Lorretta HarpXilin Niu, MD ED    Advance Directive Documentation        Most Recent Value   Type of Advance Directive  Living will   Pre-existing out of facility DNR order (yellow form or pink MOST form)     "MOST" Form in Place?         Prognosis:   Hours - Days  Discharge Planning:  Anticipated Hospital Death  Care plan was discussed with family, RN  Thank you for allowing the Palliative Medicine Team to assist in the care of this patient.   Time In: 915 Time Out: 0935 Total Time 20min Prolonged Time Billed no      Greater than 50%  of this time was spent counseling and coordinating care related to the above assessment and plan.  Evolett Somarriba, DO  Please contact Palliative Medicine Team phone at [phone] for questions and concerns.

## 2015-07-04 NOTE — Discharge Summary (Addendum)
Death Summary  Drexel IhaHerline H Mcgovern ZOX:[id number] DOB: 1916-02-16 DOA: Feb 09, 2015  PCP: Gaye AlkenBARNES,ELIZABETH STEWART, MD  Admit date: Feb 09, 2015 Date of Death: 06/10/2015  Final Diagnoses:    Comfort care.    Sepsis, UTI (lower urinary tract infection)   NSTEMI   LBBB (left bundle branch block)   HTN (hypertension)   Aortic stenosis   Dyslipidemia   Hearing loss of both ears   Hypertension   High cholesterol   Hypothyroidism   GERD (gastroesophageal reflux disease)   Dementia   Sepsis (HCC)   Elevated troponin   AKI (acute kidney injury) (HCC)   Pressure ulcer   History of present illness:  Briarrose Alver SorrowH Brod is a 82 y.o. female with medical history significant of dementia, hypertension, hyperlipidemia, GERD, hypothyroidism, CAD, myocardial infarction, SAH, aortic stenosis, heart hearing, who presents with worsening mental status and UTI.  Patient has dementia and worsening mental status, is unable to provide accurate medical history, therefore, most of the history is obtained by discussing the case with ED physician and her daughter.   Per patient's daughter, patient was found to have cloudy urine and bad urine smell in the past several days. She is more confused than her baseline. She was diagnosed with UTI by her PCP yesterday. She was given prescription of ciprofloxacin. Patient took 1 pill of ciprofloxacin. At baseline, patient has agitation due to dementia, but this has been worsening. She is screaming a lot. Not sure if patient has dysuria or burning on urination. Per her daughter, patient does not seem to have cough, chest pain or shortness of breath. No nausea, vomiting, diarrhea or abdominal pain. She moves all extremities normally.  ED Course: pt was found to have hypotension with blood pressure 78/57, which improved to SBP of ~90s after 2.5 L of NS in ED, positive urinalysis with large amount of leukocytes, lactate 5.07, troponin elevation 0.25, WBC 17.1, temperature normal,  tachycardia, tachypnea, acute renal injury with creatinine 1.54. Patient is admitted to stepdown bed for further eval and treatment.  Hospital Course:  Drexel IhaHerline H Devore is a 82 y.o. female with medical history significant of dementia, hypertension, hyperlipidemia, GERD, hypothyroidism, CAD, myocardial infarction, SAH, aortic stenosis, heart of hearing, who presents with worsening mental status and UTI. Patient was treated for sepsis, encephalopathy with IV fluids, IV antibiotics. Patient didn't improved clinically, after conversation with family it was decide to focus on comfort care. Palliative care consulted and helping with care. Patient was started on morphine Gtt to help control pain. Patient time of death pronounced 13;10.    UTI and sepsis: sepsis presented on admission, Patient septic due to UTI, with elevated lactate 5.07, hypotension, tachycardia, tachypnea. Blood pressure responded to IV fluid. Patient was treated with IV fluids, IV antibiotics. Her medical condition didnt improved. Dr Mosetta PuttFeng discussed with family an decision was to focus on comfort and palliative care was consulted.  See full consult note from Dr Phillips OdorGolding.  Patient is full comfort care. Patient comfortable on morphine Gtt.  Expected  hospital death.   Patient time of death pronounced 13;10.    Acute encephalopathy and worsening mental status with baseline dementia:  Most likely due to sepsis UTI/NSTEMI -Treated UTI as above -infection seems has been controlled, but no clinical improvement. Comfort care.   NSTEMI and hx of CAD/MI/LBBB  ekg no st elevation, bp stable, on room air .  Might be related to sepsis.  Comfort care now.  -patient is not a candidate for invasive procedure due to advance  age and baseline dementia, not a candidate for heparin due to h/o subarachnoid hemorrhage. - FLP ldl 67, TSH 2.4 , A1C 6.2 - 2d echo ED 25-30%, no prior echo to compare - troponin continue to increase, no  meaningful clinical improvement though bp better, cr and lactic acid normalized,  Transition to comfort care.   Hx of HTN: presented with sepsis and hypotesion -hold home blood pressure medications: Nifedipine and Cozaar - bp better  HLD: Last LDL was not on record -Hold home medications: Zocor due to altered mental status -ldl 67  Hypothyroidism: Per her daughter, patient has not been taking medications consistently. - TSH 2.4  Dementia with agitation: -Hold oral donepezil -When necessary Ativan for agitation   AKI: cr baseline 0.7-0.8, 1.54 on admission Likely due to prerenal secondary to dehydration and continuation of ARB. ATN is also possible due to hypotension - IVF as above, Hold cozarr, treat uti - Cr 1.54-1.25-0.86.   Pressure Ulcer stage I, sacrum. Local care   Time: of death 13;10  Signed:  Hartley Barefoot A  [facility] 07-13-2015, 3:24 PM

## 2015-07-04 NOTE — Consult Note (Signed)
Chaplain responding to spiritual care consult re: EOL.   Provided grief support, life review, and prayers with family at bedside.

## 2015-07-04 NOTE — Progress Notes (Signed)
PROGRESS NOTE  Marisa Sweeney JXB:[id number] DOB: 1916/03/01 DOA: 06/08/2015 PCP: Gaye AlkenBARNES,ELIZABETH STEWART, MD  HPI/Recap of past 24 hours: Marisa IhaHerline H Speth is a 82 y.o. female with medical history significant of dementia, hypertension, hyperlipidemia, GERD, hypothyroidism, CAD, myocardial infarction, SAH, aortic stenosis, heart of  hearing, who presents with worsening mental status and UTI. Patient was treated for sepsis, encephalopathy with IV fluids, IV antibiotics. Patient didn't improved clinically, after conversation with family it was decide to focus on comfort care. Palliative care consulted and helping with care.     Assessment/Plan: Principal Problem:   UTI (lower urinary tract infection) Active Problems:   LBBB (left bundle branch block)   HTN (hypertension)   Aortic stenosis   Dyslipidemia   Hearing loss of both ears   Hypertension   High cholesterol   Hypothyroidism   GERD (gastroesophageal reflux disease)   Dementia   Sepsis (HCC)   Elevated troponin   AKI (acute kidney injury) (HCC)   Pressure ulcer   UTI and sepsis: sepsis presented on admission, Patient  septic due to UTI, with elevated lactate 5.07, hypotension, tachycardia, tachypnea. Blood pressure responded to IV fluid. Patient was treated with IV fluids, IV antibiotics. Her medical condition didnt improved. Dr Mosetta PuttFeng discussed with family an decision was to focus on comfort and palliative care was consulted.  See full consult note from Dr Phillips OdorGolding.  Patient is full comfort care. Patient comfortable on morphine Gtt.  Expect hospital death.    Acute encephalopathy and worsening mental status with baseline dementia: Most likely due to sepsis UTI/NSTEMI -Treated UTI as above -infection seems has been controlled, but no clinical improvement. Comfort care.   NSTEMI and hx of CAD/MI/LBBB  ekg no st elevation, bp stable, on room air .  Might be related to sepsis.  Comfort care now.  -patient is not a  candidate for invasive procedure due to advance age and baseline dementia, not a candidate for heparin due to h/o subarachnoid hemorrhage. -  FLP ldl 67, TSH  2.4 , A1C 6.2 - 2d echo ED 25-30%, no prior echo to compare - troponin continue to increase, no meaningful clinical improvement though bp better, cr and lactic acid normalized,  Transition to comfort care.   Hx of HTN: presented with sepsis and  hypotesion -hold home blood pressure medications: Nifedipine and Cozaar - bp better  HLD: Last LDL was not on record -Hold home medications: Zocor due to altered mental status -ldl 67  Hypothyroidism:  Per her daughter, patient has not been taking medications consistently. - TSH 2.4  Dementia with agitation: -Hold oral donepezil -When necessary Ativan for agitation   AKI: cr baseline 0.7-0.8, 1.54 on admission Likely due to prerenal secondary to dehydration and continuation of ARB. ATN is also possible due to hypotension - IVF as above, Hold cozarr, treat uti -  Cr 1.54-1.25-0.86    DVT ppx: subQ Lovenox d/ced, now on comfort measures Code Status: DNR.  Family Communication: daughter at bedside.  Disposition Plan: in hospital death  Consults called: palliative care      Procedures:  none  Antibiotics:  Rocephin from admission to 5/23   Objective: BP 71/32 mmHg  Pulse 65  Temp(Src) 98.2 F (36.8 C) (Axillary)  Resp 8  Ht 5\' 4"  (1.626 m)  Wt 57.9 kg (127 lb 10.3 oz)  BMI 21.90 kg/m2  SpO2 79%  Intake/Output Summary (Last 24 hours) at Jan 27, 2016 1244 Last data filed at Jan 27, 2016 0600  Gross per 24 hour  Intake 583.38 ml  Output      0 ml  Net 583.38 ml   Filed Weights   07/02/2015 1901 06/24/15 0100  Weight: 62.143 kg (137 lb) 57.9 kg (127 lb 10.3 oz)    Exam:   General: lethargic   Cardiovascular: RRR  Respiratory: CTABL  Abdomen: Soft/ND/NT, positive BS  Musculoskeletal: No Edema  Neuro: dementia  Data Reviewed: Basic Metabolic  Panel:  Recent Labs Lab 06/16/2015 1752 06/24/15 0315 06/25/15 0332  NA 137 141 144  K 4.1 4.4 4.0  CL 102 113* 117*  CO2 24 22 20*  GLUCOSE 164* 175* 112*  BUN 37* 35* 39*  CREATININE 1.54* 1.25* 0.86  CALCIUM 8.6* 7.4* 7.4*   Liver Function Tests:  Recent Labs Lab 06/22/2015 1752 06/25/15 0332  AST 28 87*  ALT 17 27  ALKPHOS 132* 76  BILITOT 1.0 0.8  PROT 5.7* 4.9*  ALBUMIN 2.8* 2.2*   No results for input(s): LIPASE, AMYLASE in the last 168 hours. No results for input(s): AMMONIA in the last 168 hours. CBC:  Recent Labs Lab 06/30/2015 1752 06/24/15 0315 06/25/15 0332  WBC 17.1* 23.3* 22.9*  NEUTROABS 16.1*  --   --   HGB 13.7 12.4 13.8  HCT 41.8 38.9 43.1  MCV 98.1 101.8* 101.4*  PLT 143* 125* 127*   Cardiac Enzymes:    Recent Labs Lab 06/19/2015 1752 06/25/2015 2130 06/24/15 0315 06/24/15 0904 06/25/15 0332  TROPONINI 0.25* 0.16* 1.60* 6.27* 13.49*   BNP (last 3 results) No results for input(s): BNP in the last 8760 hours.  ProBNP (last 3 results) No results for input(s): PROBNP in the last 8760 hours.  CBG:  Recent Labs Lab 06/24/15 0416 06/24/15 0726 06/24/15 0840 06/25/15 0852  GLUCAP 169* 147* 128* 96    Recent Results (from the past 240 hour(s))  Blood Culture (routine x 2)     Status: None (Preliminary result)   Collection Time: 06/20/2015  5:50 PM  Result Value Ref Range Status   Specimen Description BLOOD RIGHT ANTECUBITAL  Final   Special Requests BOTTLES DRAWN AEROBIC AND ANAEROBIC 10CC  Final   Culture   Final    NO GROWTH 3 DAYS Performed at [facility]    Report Status PENDING  Incomplete  Blood Culture (routine x 2)     Status: None (Preliminary result)   Collection Time: 06/15/2015  6:30 PM  Result Value Ref Range Status   Specimen Description BLOOD LEFT ANTECUBITAL  Final   Special Requests BOTTLES DRAWN AEROBIC AND ANAEROBIC 10CC  Final   Culture   Final    NO GROWTH 3 DAYS Performed at [facility]     Report Status PENDING  Incomplete  Urine culture     Status: Abnormal   Collection Time: 06/15/2015  7:40 PM  Result Value Ref Range Status   Specimen Description URINE, CATHETERIZED  Final   Special Requests NONE  Final   Culture MULTIPLE SPECIES PRESENT, SUGGEST RECOLLECTION (A)  Final   Report Status 06/25/2015 FINAL  Final  MRSA PCR Screening     Status: None   Collection Time: 06/24/15  2:24 AM  Result Value Ref Range Status   MRSA by PCR NEGATIVE NEGATIVE Final    Comment:        The GeneXpert MRSA Assay (FDA approved for NASAL specimens only), is one component of a comprehensive MRSA colonization surveillance program. It is not intended to diagnose MRSA infection nor to guide or monitor treatment  for MRSA infections.      Studies: No results found.  Scheduled Meds: . sodium chloride flush  3 mL Intravenous Q12H    Continuous Infusions: . sodium chloride 20 mL/hr at 06/26/15 1139  . morphine 4 mg/hr (06/27/15 1822)     Time spent:  Alba Cory MD, PhD  [facility] Pager [phone]. If 7PM-7AM, please contact night-coverage at [email or web address], password [facility] 06/22/2015, 12:44 PM  LOS: 5 days

## 2015-07-04 DEATH — deceased

## 2015-08-08 NOTE — Consult Note (Signed)
Consultation Note Date: 08/08/2015   Patient Name: Marisa Sweeney  DOB: [birth year]  MRN: [id number]  Age / Sex: 82 y.o., female  PCP: Juluis RainierElizabeth Barnes, MD Referring Physician: No att. providers found  Reason for Consultation: Establishing goals of care, Hospice Evaluation and Pain control  HPI/Patient Profile: 82 y.o. female with medical history significant of dementia, hypertension, hyperlipidemia, GERD, hypothyroidism, CAD, myocardial infarction, SAH, aortic stenosis, HOH, who presented with worsening mental status and UTI. Patient was treated for sepsis, encephalopathy with IV fluids, IV antibiotics. Patient has not mproved clinically.  Clinical Assessment and Goals of Care: Marisa Sweeney has end stage dementia and was admitted for sepsis. Her previously defined goals of care were for comfort and focus on QOL, but she was sent to ED from her SNF. Hospice referral had been discussed but transition had not occurred.  HCPOA    SUMMARY OF RECOMMENDATIONS   Patient with increased agitation, terminal delirium this AM. I spoke with her daughter Dillard EssexMary-goals are full comfort. Will start Morphine infusion at 2mg /hr with bolus dosing for comfort. Will use ativan for agitation. I suspect she will have a hospital death within 2224 hours-daughter Corrie DandyMary would prefer not to move her if possible but I discussed the option of residential hospice care if her mom stabilizes and can transport.   Code Status/Advance Care Planning:  DNR    Symptom Management:   Initiate morphine infusion at 2mg /hr  FULL COMFORT  Palliative Prophylaxis:   Aspiration, Delirium Protocol, Eye Care, Frequent Pain Assessment, Oral Care and Turn Reposition  Additional Recommendations (Limitations, Scope, Preferences):  Full Comfort Care  Psycho-social/Spiritual:   Desire for further Chaplaincy support:yes  Additional Recommendations:  Education on Hospice and Grief/Bereavement Support  Prognosis:   Hours - Days  Discharge Planning: Anticipated Hospital Death      Primary Diagnoses: Present on Admission:  . LBBB (left bundle branch block) . HTN (hypertension) . Dyslipidemia . Hearing loss of both ears . Hypertension . High cholesterol . Hypothyroidism . GERD (gastroesophageal reflux disease) . Dementia . UTI (lower urinary tract infection) . Sepsis (HCC) . Elevated troponin . AKI (acute kidney injury) (HCC)  I have reviewed the medical record, interviewed the patient and family, and examined the patient. The following aspects are pertinent.  Past Medical History  Diagnosis Date  . Hypertension   . Thyroid disease   . High cholesterol     h/o  . Heart murmur   . Myocardial infarction (HCC) 1995  . Hypothyroidism   . GERD (gastroesophageal reflux disease)   . Constipation   . Hemorrhoid     h/o  . SAH (subarachnoid hemorrhage) (HCC) 01/12/11    right frontal  . Dementia    Social History   Social History  . Marital Status: Widowed    Spouse Name: N/A  . Number of Children: N/A  . Years of Education: N/A   Social History Main Topics  . Smoking status: Never Smoker   . Smokeless tobacco: Never Used  . Alcohol Use: No  .  Drug Use: No  . Sexual Activity: No   Other Topics Concern  . None   Social History Narrative   Daughter lives with her in the house   Uses a walker   Very HOH   Drinks one cup of coffee 3-4x a week    History reviewed. No pertinent family history. Scheduled Meds: Continuous Infusions: PRN Meds:. Medications Prior to Admission:  Prior to Admission medications   Medication Sig Start Date End Date Taking? Authorizing Provider  ciprofloxacin (CIPRO) 250 MG tablet Take 250 mg by mouth 2 (two) times daily. 22-Aug-2015  Yes Historical Provider, MD  donepezil (ARICEPT) 10 MG tablet Take 10 mg by mouth at bedtime. 06/06/15  Yes Historical Provider, MD  levothyroxine  (SYNTHROID, LEVOTHROID) 50 MCG tablet Take 50 mcg by mouth daily before breakfast.   Yes Historical Provider, MD  levothyroxine (SYNTHROID, LEVOTHROID) 75 MCG tablet Take 75 mcg by mouth daily before breakfast. Takes on Tues, Thurs, and Sat   Yes Historical Provider, MD  losartan (COZAAR) 50 MG tablet Take 50 mg by mouth daily. Reported on 02/19/15   Yes Historical Provider, MD  NIFEdipine (PROCARDIA-XL/ADALAT-CC/NIFEDICAL-XL) 30 MG 24 hr tablet Take 30 mg by mouth daily. 05/30/15  Yes Historical Provider, MD  traMADol (ULTRAM) 50 MG tablet Take 50-100 mg by mouth every 6 (six) hours as needed (pain). Maximum dose= 8 tablets per day  For pain   Yes Historical Provider, MD  NIFEdipine (ADALAT CC) 90 MG 24 hr tablet Take 1 tablet (90 mg total) by mouth daily. Patient not taking: Reported on 02/19/15 03/11/12   Pricilla RifflePaula V Ross, MD  simvastatin (ZOCOR) 40 MG tablet Take 1 tablet (40 mg total) by mouth every evening. Patient not taking: Reported on 03/28/2015 03/11/12   Pricilla RifflePaula V Ross, MD   Allergies  Allergen Reactions  . Influenza Vaccines     Severe cold   Review of Systems  Physical Exam  Vital Signs: BP 71/32 mmHg  Pulse 65  Temp(Src) 98.2 F (36.8 C) (Axillary)  Resp 8  Ht 5\' 4"  (1.626 m)  Wt 57.9 kg (127 lb 10.3 oz)  BMI 21.90 kg/m2  SpO2 79% Pain Assessment: PAINAD   Pain Score: 3    SpO2: SpO2: (!) 79 % O2 Device:SpO2: (!) 79 % O2 Flow Rate: .O2 Flow Rate (L/min): 2 L/min  IO: Intake/output summary: No intake or output data in the 24 hours ending 08/08/15 1119  LBM: Last BM Date: 020-Jul-2017 Baseline Weight: Weight: 62.143 kg (137 lb) Most recent weight: Weight: 57.9 kg (127 lb 10.3 oz)     Palliative Assessment/Data:   Flowsheet Rows        Most Recent Value   Intake Tab    Referral Department  Hospitalist   Unit at Time of Referral  Oncology Unit   Palliative Care Primary Diagnosis  Cardiac   Date Notified  06/25/15   Palliative Care Type  New Palliative care   Reason  for referral  Clarify Goals of Care   Date of Admission  020-Jul-2017   Date first seen by Palliative Care  06/25/15   # of days Palliative referral response time  0 Day(s)   # of days IP prior to Palliative referral  2   Clinical Assessment    Psychosocial & Spiritual Assessment    Palliative Care Outcomes       Time In: 1200  Time Out: 12:50PM Time Total: 50 minutes Greater than 50%  of this time was spent counseling  and coordinating care related to the above assessment and plan.  Signed by: Anderson Malta, DO   Please contact Palliative Medicine Team phone at [phone] for questions and concerns.  For individual provider: See Loretha Stapler

## 2018-02-24 IMAGING — DX DG SACRUM/COCCYX 2+V
3 series · 3 of 3 positions shown · non-contrast
Comparison: None.

CLINICAL DATA: Multiple falls today.  Low back pain.

EXAM:
SACRUM AND COCCYX - 2+ VIEW

[coccyx ap]
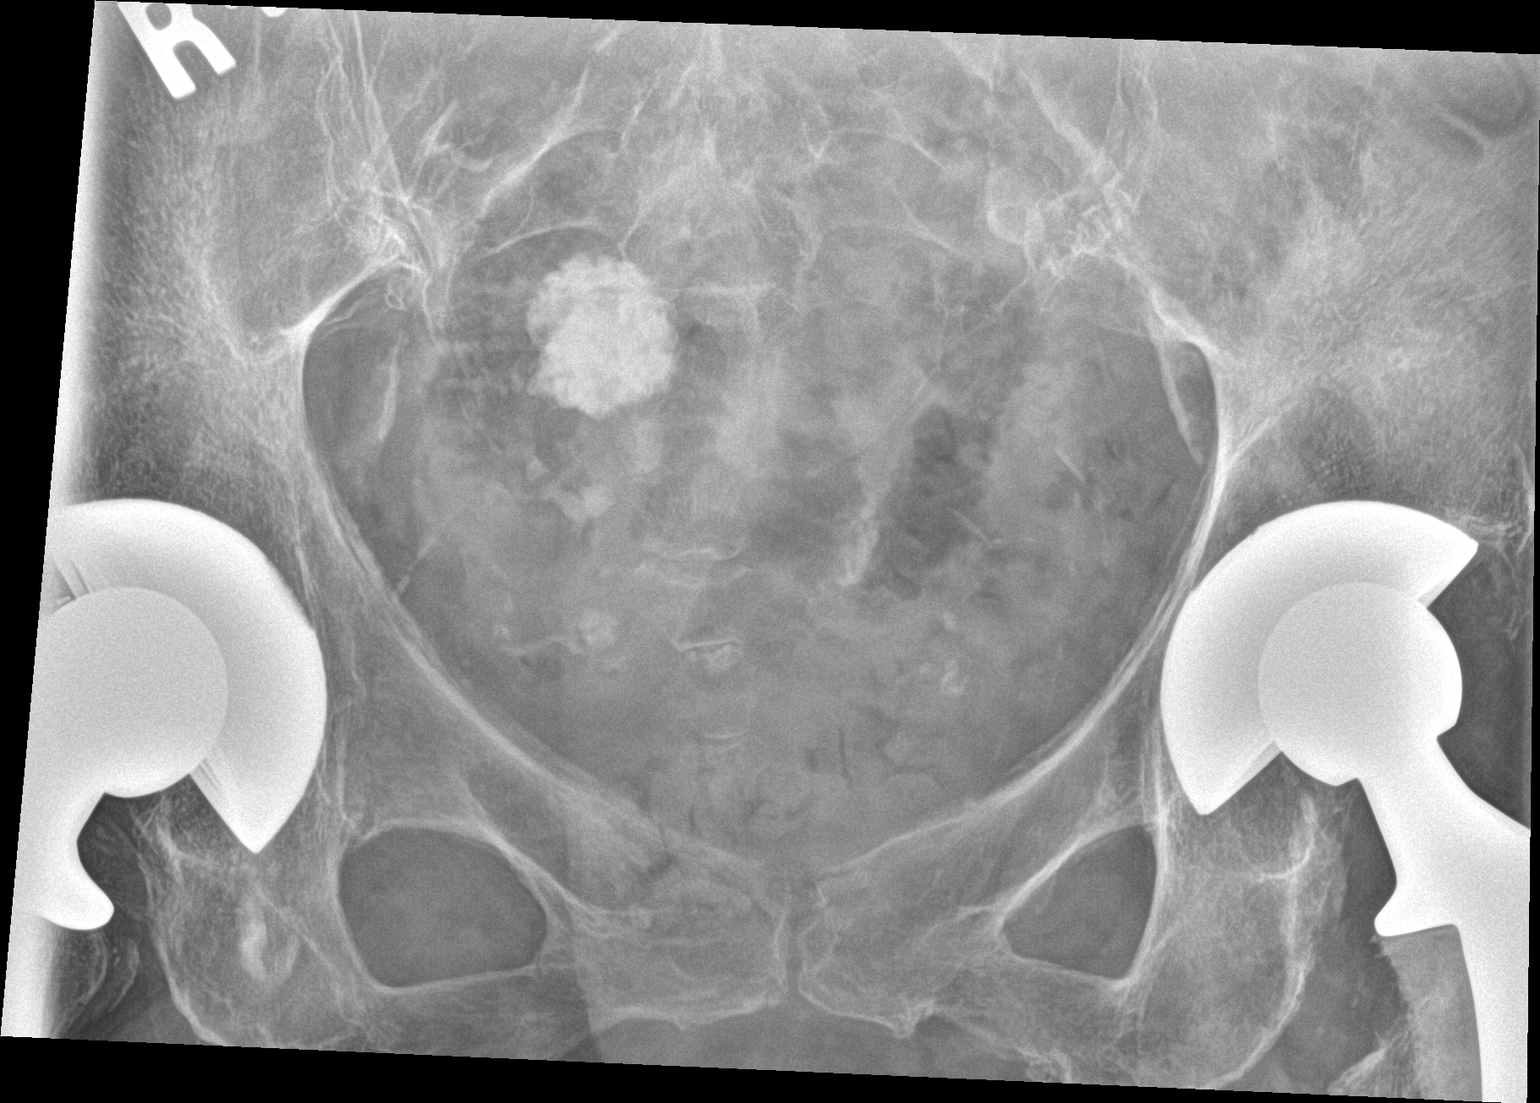

[sacrum ap]
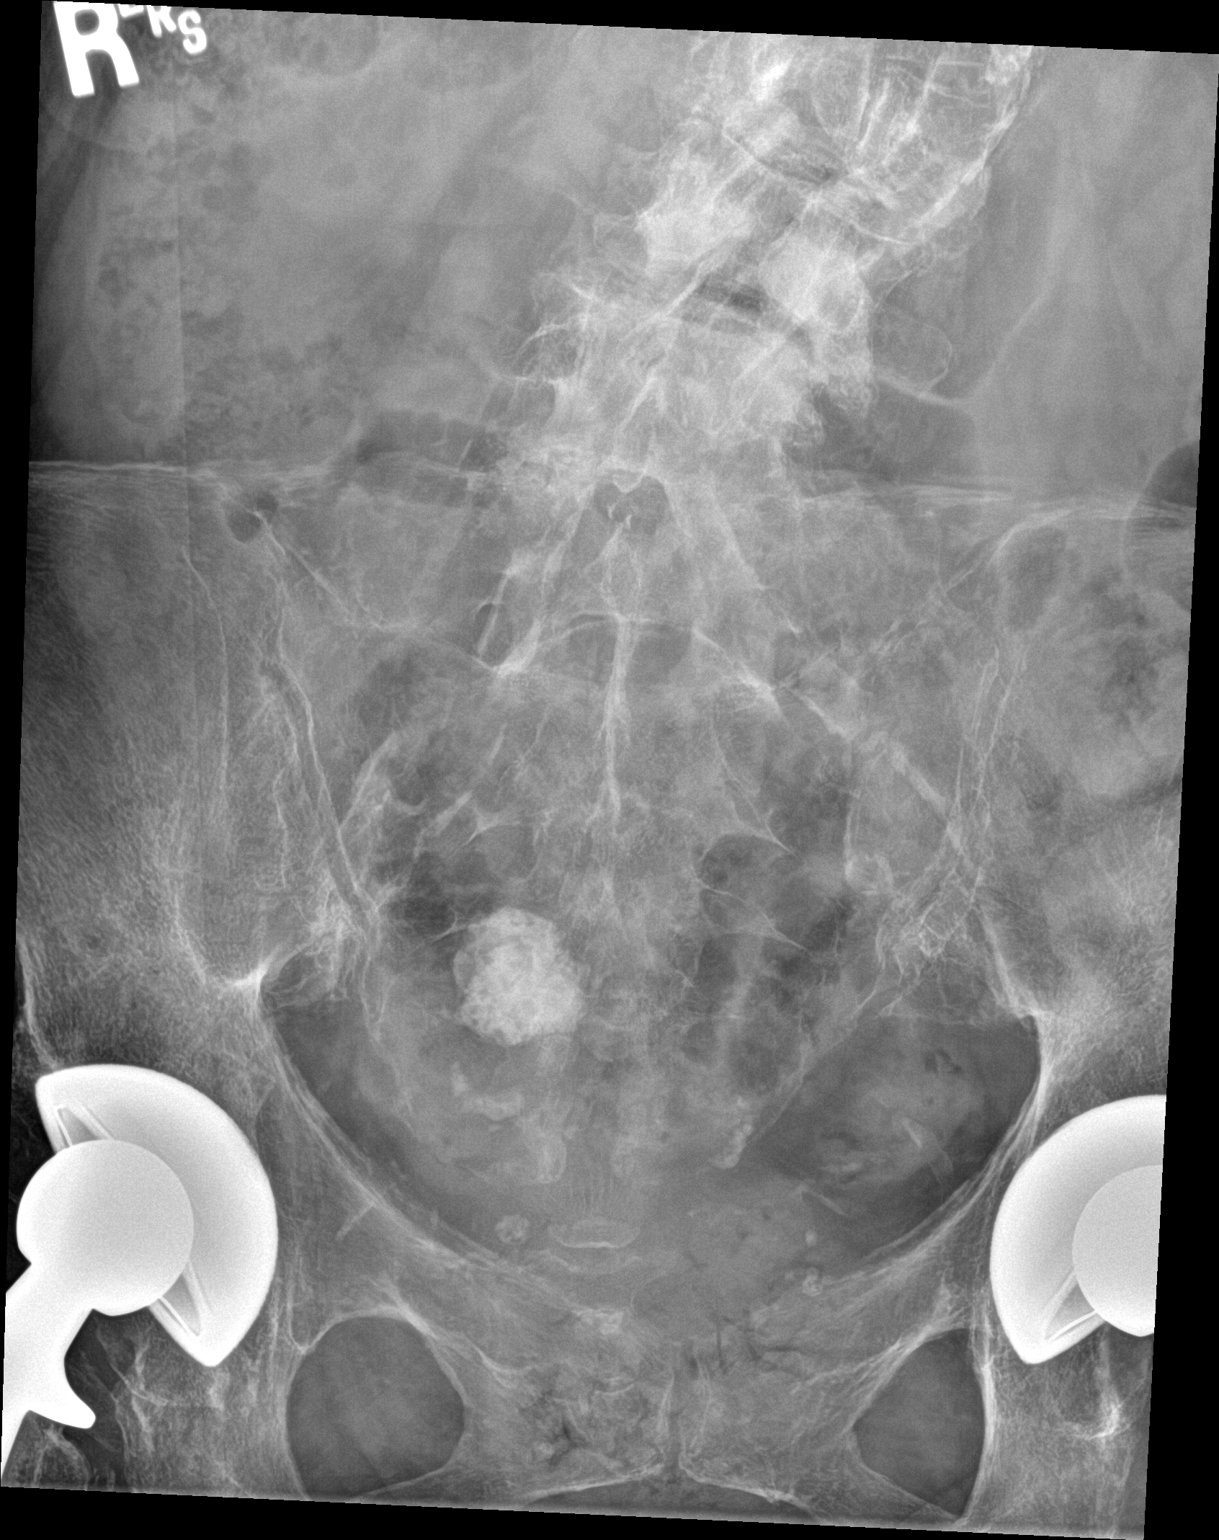

[sacrum lat]
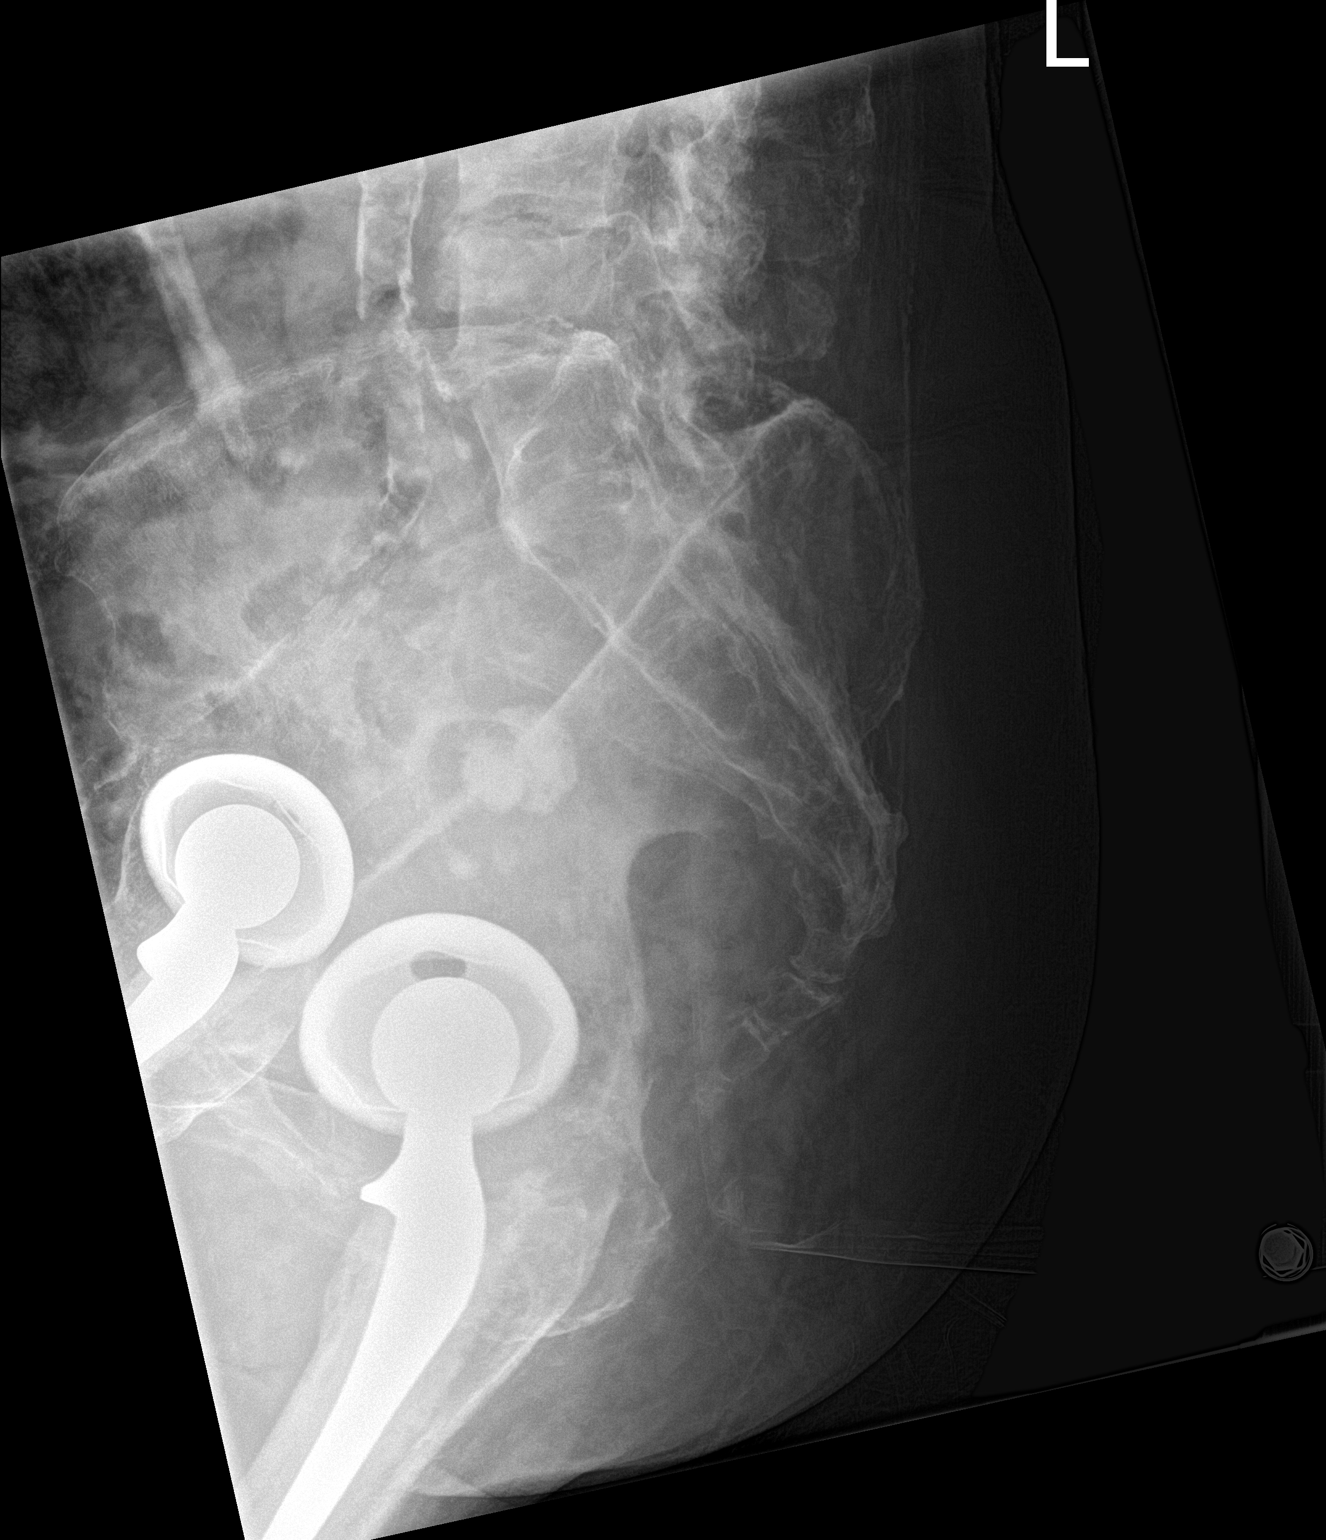

[3 of 3 positions shown; findings below may reference images not displayed]

FINDINGS: Diffuse bone demineralization and stool limit evaluation of the
sacral coccygeal spine. Visualized sacral struts appear intact. SI
joints are not displaced. No focal bone lesion or bone destruction.
Normal alignment of the sacral spine. No displaced fractures
identified. Incidental note of bilateral hip arthroplasties.
Calcification in the pelvis consistent with fibroid. Vascular
calcifications. Degenerative changes in the lower lumbar spine.
IMPRESSION: No acute bony abnormalities demonstrated.

## 2018-05-11 IMAGING — DX DG CHEST 1V PORT
1 series · 1 of 1 positions shown · non-contrast
Comparison: 03/28/2015 and earlier.

CLINICAL DATA: [AGE] with current history of dementia,
currently being treated for urinary tract infection, presenting with
acute mental status changes.

EXAM:
PORTABLE CHEST 1 VIEW

[chest ap]
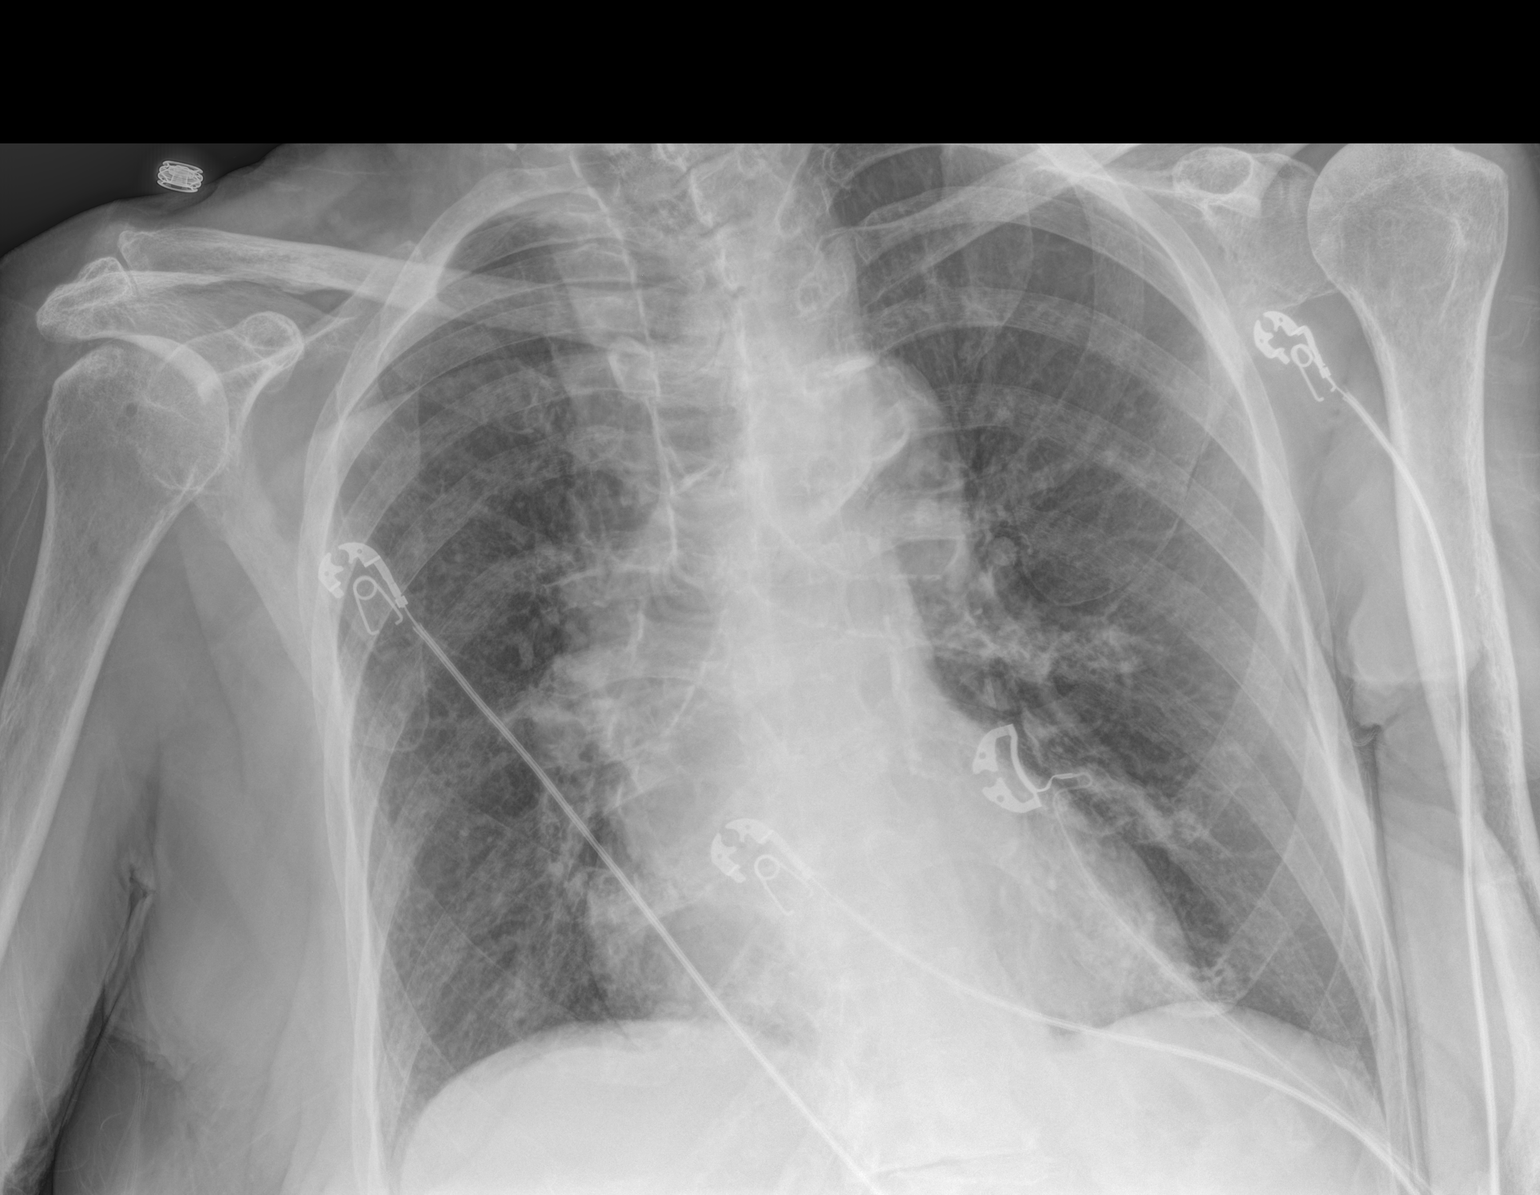

[1 of 1 positions shown; findings below may reference images not displayed]

FINDINGS: Cardiac silhouette mildly enlarged for AP portable technique,
unchanged. Prominent bronchovascular markings diffusely, more so
than on the prior examinations. Lungs otherwise clear. No confluent
airspace consolidation. No pleural effusions. Pulmonary vascularity
normal.
IMPRESSION: Stable mild cardiomegaly. Possible mild acute bronchitis. No acute
cardiopulmonary disease otherwise.
